# Patient Record
Sex: Female | Born: 1985 | Race: White | Hispanic: No | Marital: Married | Smoking: Never smoker
Health system: Southern US, Community
[De-identification: ages and names within clinical notes are randomized; demographics above are authoritative.]

## PROBLEM LIST (undated history)

## (undated) ENCOUNTER — Inpatient Hospital Stay (HOSPITAL_COMMUNITY): Payer: 59

## (undated) DIAGNOSIS — N946 Dysmenorrhea, unspecified: Secondary | ICD-10-CM

## (undated) DIAGNOSIS — N809 Endometriosis, unspecified: Secondary | ICD-10-CM

## (undated) DIAGNOSIS — R87629 Unspecified abnormal cytological findings in specimens from vagina: Secondary | ICD-10-CM

## (undated) DIAGNOSIS — B2790 Infectious mononucleosis, unspecified without complication: Secondary | ICD-10-CM

## (undated) DIAGNOSIS — N83209 Unspecified ovarian cyst, unspecified side: Secondary | ICD-10-CM

## (undated) DIAGNOSIS — N6019 Diffuse cystic mastopathy of unspecified breast: Secondary | ICD-10-CM

## (undated) DIAGNOSIS — N979 Female infertility, unspecified: Secondary | ICD-10-CM

## (undated) DIAGNOSIS — T7840XA Allergy, unspecified, initial encounter: Secondary | ICD-10-CM

## (undated) HISTORY — DX: Unspecified ovarian cyst, unspecified side: N83.209

## (undated) HISTORY — DX: Allergy, unspecified, initial encounter: T78.40XA

## (undated) HISTORY — DX: Infectious mononucleosis, unspecified without complication: B27.90

## (undated) HISTORY — PX: WISDOM TOOTH EXTRACTION: SHX21

## (undated) HISTORY — DX: Diffuse cystic mastopathy of unspecified breast: N60.19

## (undated) HISTORY — DX: Dysmenorrhea, unspecified: N94.6

---
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
## 2005-09-17 ENCOUNTER — Other Ambulatory Visit: Admission: RE | Admit: 2005-09-17 | Discharge: 2005-09-17 | Payer: Self-pay | Admitting: Family Medicine

## 2005-09-17 ENCOUNTER — Ambulatory Visit: Payer: Self-pay | Admitting: Family Medicine

## 2005-11-06 ENCOUNTER — Encounter: Admission: RE | Admit: 2005-11-06 | Discharge: 2005-11-06 | Payer: Self-pay | Admitting: Family Medicine

## 2006-09-20 ENCOUNTER — Other Ambulatory Visit: Admission: RE | Admit: 2006-09-20 | Discharge: 2006-09-20 | Payer: Self-pay | Admitting: Family Medicine

## 2006-09-20 ENCOUNTER — Ambulatory Visit: Payer: Self-pay | Admitting: Family Medicine

## 2008-07-10 ENCOUNTER — Ambulatory Visit: Payer: Self-pay | Admitting: Family Medicine

## 2008-08-16 ENCOUNTER — Ambulatory Visit: Payer: Self-pay | Admitting: Family Medicine

## 2009-02-28 ENCOUNTER — Ambulatory Visit: Payer: Self-pay | Admitting: Family Medicine

## 2009-06-27 ENCOUNTER — Other Ambulatory Visit: Admission: RE | Admit: 2009-06-27 | Discharge: 2009-06-27 | Payer: Self-pay | Admitting: Family Medicine

## 2009-06-27 ENCOUNTER — Ambulatory Visit: Payer: Self-pay | Admitting: Physician Assistant

## 2010-07-04 ENCOUNTER — Encounter: Payer: Self-pay | Admitting: Family Medicine

## 2010-07-09 ENCOUNTER — Encounter: Payer: Self-pay | Admitting: Family Medicine

## 2010-07-09 ENCOUNTER — Encounter: Payer: Self-pay | Admitting: *Deleted

## 2010-07-09 ENCOUNTER — Ambulatory Visit (INDEPENDENT_AMBULATORY_CARE_PROVIDER_SITE_OTHER): Payer: 59 | Admitting: Family Medicine

## 2010-07-09 VITALS — BP 120/78 | HR 72 | Ht 65.0 in | Wt 186.0 lb

## 2010-07-09 DIAGNOSIS — Z0000 Encounter for general adult medical examination without abnormal findings: Secondary | ICD-10-CM

## 2010-07-09 DIAGNOSIS — N946 Dysmenorrhea, unspecified: Secondary | ICD-10-CM | POA: Insufficient documentation

## 2010-07-09 MED ORDER — LEVONORGESTREL-ETHINYL ESTRAD 0.1-20 MG-MCG PO TABS: 1.0000 | ORAL_TABLET | Freq: Every day | ORAL | Status: DC

## 2010-07-09 NOTE — Progress Notes (Signed)
Subjective:    Patient ID: Catherine Hill, female    DOB: July 31, 1985, 25 y.o.   MRN: [id number]  HPI Catherine Hill is a 25 y.o. female who presents for a complete physical.  She has the following concerns: Menstrual periods have been somewhat irregular for the last 2 months.  Has been on the same birth control for years without problems until recently.  Denies any missed pills.  Is currently on her menstrual cycle (supposed to be), but also had bleeding last week.  Heavy now, so unable to have pap/GYN exam done today.  Has never been in a sexual relationship. Had one pap showing atypical cells, but no HPV in 2005, and has had 4 normal paps since then.  She is frustrated about her weight.  Exercises a lot but is unable to lose weight.  She does notice that she has lost inches and she is wearing smaller sizes.  Has been exercising more intensively since last September.  Following a 1600 cal day diet, sometimes less. Was able to lose more when on 1200 kcal diet.  Avoids processed foods, eating organic, healthy foods.  Recently at work, which is very stressful, she has been having "heart pains".  After having a Diet Coke, developed chest pain.  This has happened 3 times, twice was within a week.  Always occurred after drinking Diet Coke.  Also felt like her heart was beating fast/racing.  She also hadn't been sleeping well (because she was thinking about and worrying about work).  At that point, she was working 14 hour days.  She no longer is taking work home with her, reads prior to going to bed, and is sleeping better. now.  Immunization History  Administered Date(s) Administered  . DTaP 09/15/1999  . IPV 09/17/2005, 07/10/2008, 06/27/2009  . Tdap 06/27/2009   Last Pap smear: 1 year ago Last mammogram: 2007 mammo and ultrasound Last colonoscopy: n/a Last DEXA: n/a Dentist--goes twice a year Ophtho--once a year (wears contacts) Exercise--4-5 times/week (runs, walks on treadmill for an hour,  occasional weights) Has used tanning bed in past  Past Medical History  Diagnosis Date  . Dysmenorrhea   . Perennial allergic rhinitis   . Fibrocystic breast disease   . Allergy     seasonal    History reviewed. No pertinent past surgical history.  History   Social History  . Marital Status: Single    Spouse Name: N/A    Number of Children: N/A  . Years of Education: N/A   Occupational History  . 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 Smoking status: Never Smoker   . Smokeless tobacco: Never Used  . Alcohol Use: Yes     socially 3-4 drinks on weekends  . Drug Use: No  . Sexually Active: No   Other Topics Concern  . Not on file   Social History Narrative  . No narrative on file    Family History  Problem Relation Age of Onset  . Thyroid disease Mother   . Hyperlipidemia Mother   . Hyperlipidemia Father   . Vision loss Father     blind in L eye (?CVA?)  . Diabetes Maternal Uncle   . Cancer Paternal Grandfather     bladder cancer    Current outpatient prescriptions:levonorgestrel-ethinyl estradiol (AVIANE,ALESSE,LESSINA) 0.1-20 MG-MCG per tablet, Take 1 tablet by mouth daily., Disp: 28 tablet, Rfl: 11;  DISCONTD: levonorgestrel-ethinyl estradiol (AVIANE,ALESSE,LESSINA) 0.1-20 MG-MCG per tablet, Take 1 tablet by mouth  daily.  , Disp: , Rfl:  DISCONTD: levonorgestrel-ethinyl estradiol (AVIANE,ALESSE,LESSINA) 0.1-20 MG-MCG per tablet, Take 1 tablet by mouth daily., Disp: 28 tablet, Rfl: 11  No Known Allergies  Review of Systems The patient denies anorexia, fever, weight changes, headaches,  vision changes, decreased hearing, ear pain, sore throat, breast concerns, dizziness, syncope, dyspnea on exertion, cough, swelling, nausea, vomiting, diarrhea, constipation, abdominal pain, melena, hematochezia, indigestion/heartburn, hematuria, incontinence, dysuria, vaginal discharge, odor or itch, genital lesions, joint pains, numbness,  tingling, weakness, tremor, suspicious skin lesions, depression, anxiety, abnormal bleeding/bruising, or enlarged lymph nodes.  +menstrual irregularities x 2 months, palpitations and chest pain after Diet Coke    Objective:   Physical Exam BP 120/78  Pulse 72  Ht 5\' 5"  (1.651 m)  Wt 186 lb (84.369 kg)  BMI 30.95 kg/m2  LMP 06/29/2010  General Appearance:    Alert, cooperative, no distress, appears stated age, muscular  Head:    Normocephalic, without obvious abnormality, atraumatic  Eyes:    PERRL, conjunctiva/corneas clear, EOM's intact, fundi    benign  Ears:    Normal TM's and external ear canals  Nose:   Nares normal, mucosa normal, no drainage or sinus   tenderness  Throat:   Lips, mucosa, and tongue normal; teeth and gums normal  Neck:   Supple, no lymphadenopathy;  thyroid:  no   enlargement/tenderness/nodules; no carotid   bruit or JVD  Back:    Spine nontender, no curvature, ROM normal, no CVA     tenderness  Lungs:     Clear to auscultation bilaterally without wheezes, rales or     ronchi; respirations unlabored  Chest Wall:    No tenderness or deformity   Heart:    Regular rate and rhythm, S1 and S2 normal, no murmur, rub   or gallop  Breast Exam:    No tenderness, masses, or nipple discharge or inversion.      No axillary lymphadenopathy  Abdomen:     Soft, non-tender, nondistended, normoactive bowel sounds,    no masses, no hepatosplenomegaly  Genitalia:    Normal external genitalia without lesions.  BUS and vagina normal; no cervical motion tenderness.  Uterus and adnexa not enlarged, nontender, no masses.  Speculum exam and pap were not performed  Rectal:    Not performed due to age<40 and no related complaints  Extremities:   No clubbing, cyanosis or edema  Pulses:   2+ and symmetric all extremities  Skin:   Skin color, texture, turgor normal, no rashes or lesions  Lymph nodes:   Cervical, supraclavicular, and axillary nodes normal  Neurologic:   CNII-XII intact,  normal strength, sensation and gait; reflexes 2+ and symmetric throughout          Psych:   Normal mood, affect, hygiene and grooming.       Assessment & Plan:   1. Routine general medical examination at a health care facility  Visual acuity screening, Lipid panel  2. Dysmenorrhea  levonorgestrel-ethinyl estradiol (AVIANE,ALESSE,LESSINA) 0.1-20 MG-MCG per tablet, DISCONTINUED: levonorgestrel-ethinyl estradiol (AVIANE,ALESSE,LESSINA) 0.1-20 MG-MCG per tablet   Discussed monthly self breast exams and yearly mammograms after the age of 54; at least 30 minutes of aerobic activity at least 5 days/week; proper sunscreen use and avoidance of tanning booths reviewed; healthy diet, including goals of calcium and vitamin D intake and alcohol recommendations (less than or equal to 1 drink/day) reviewed; regular seatbelt use; changing batteries in smoke detectors.  Immunization recommendations discussed--she is up to date.  Reassured that  if she is continuing to lose inches, that she is getting benefit from her diet and exercise, and should soon show on the scale as well.  Healthy diet, portions, cutting back on calories to 1200-1400 kcal daily for faster weight loss.  Continue daily exercise.  Reassured that it sounds as though stress and caffeine (mainly caffeine) was related to her episodes of palpitations and chest pains (that she was referring to as anxiety attacks).  No other symptoms of anxiety.  Follow up if continues to have problems with palpitations, chest pain; pt to avoid caffeine

## 2010-07-10 ENCOUNTER — Other Ambulatory Visit: Payer: Self-pay | Admitting: *Deleted

## 2010-07-10 ENCOUNTER — Other Ambulatory Visit: Payer: 59

## 2010-07-10 DIAGNOSIS — Z0000 Encounter for general adult medical examination without abnormal findings: Secondary | ICD-10-CM

## 2010-07-11 ENCOUNTER — Other Ambulatory Visit: Payer: Self-pay | Admitting: *Deleted

## 2010-07-11 ENCOUNTER — Other Ambulatory Visit: Payer: 59

## 2010-07-11 DIAGNOSIS — Z0000 Encounter for general adult medical examination without abnormal findings: Secondary | ICD-10-CM

## 2010-07-11 LAB — LIPID PANEL
Cholesterol: 191 mg/dL (ref 0–200)
Total CHOL/HDL Ratio: 4.2 Ratio
VLDL: 13 mg/dL (ref 0–40)

## 2010-07-14 ENCOUNTER — Telehealth: Payer: Self-pay | Admitting: *Deleted

## 2010-07-14 NOTE — Telephone Encounter (Signed)
Left message on pt's cell number to return my call for lab results.

## 2010-07-16 ENCOUNTER — Telehealth: Payer: Self-pay | Admitting: *Deleted

## 2010-07-16 NOTE — Telephone Encounter (Signed)
Spoke with patient, she was given lab results and I mailed out diet info to her.

## 2010-07-16 NOTE — Telephone Encounter (Signed)
Message copied by Melonie Florida on Wed Jul 16, 2010 11:00 AM ------      Message from: Joselyn Arrow      Created: Sun Jul 13, 2010  9:36 PM       Advise pt borderline high cholesterol.  Goal LDL <130, HDL>50.  Please send info on low cholesterol diet.  Re-check next year

## 2011-02-04 ENCOUNTER — Encounter: Payer: Self-pay | Admitting: Medical

## 2011-02-04 ENCOUNTER — Ambulatory Visit (INDEPENDENT_AMBULATORY_CARE_PROVIDER_SITE_OTHER): Payer: 59 | Admitting: Medical

## 2011-02-04 DIAGNOSIS — H68009 Unspecified Eustachian salpingitis, unspecified ear: Secondary | ICD-10-CM | POA: Insufficient documentation

## 2011-02-04 MED ORDER — AMOXICILLIN 875 MG PO TABS: 875.0000 mg | ORAL_TABLET | Freq: Two times a day (BID) | ORAL | Status: AC

## 2011-02-04 NOTE — Progress Notes (Signed)
Subjective:   HPI  Catherine Hill is a 25 y.o. female who presents with 3 day hx/o cold symptoms, but now has worse right ear pain, lymph nodes are swollen on the right, and feels fatigued.  Feels some dizziness today.  Denies fever, sore throat, cough, headache.  Using some Ibuprofen.  No other aggravating or relieving factors.    No other c/o.  The following portions of the patient's history were reviewed and updated as appropriate: allergies, current medications, past family history, past medical history, past social history, past surgical history and problem list.  Past Medical History  Diagnosis Date  . Dysmenorrhea   . Perennial allergic rhinitis   . Fibrocystic breast disease   . Allergy     seasonal    Review of Systems Constitutional: -fever, -chills, -sweats, -unexpected -weight change,-fatigue ENT: -runny nose, +ear pain, -sore throat Cardiology:  -chest pain, -palpitations, -edema Respiratory: -cough, -shortness of breath, -wheezing Gastroenterology: -abdominal pain, -nausea, -vomiting, -diarrhea, -constipation Hematology: -bleeding or bruising problems Musculoskeletal: -arthralgias, -myalgias, -joint swelling, -back pain Ophthalmology: -vision changes Urology: -dysuria, -difficulty urinating, -hematuria, -urinary frequency, -urgency Neurology: -headache, -weakness, -tingling, -numbness, + DIZZY   Objective:   Filed Vitals:   02/04/11 1343  BP: 110/70  Pulse: 68  Temp: 98.3 F (36.8 C)  Resp: 16    General appearance: Alert, WD/WN, no distress                             Skin: warm, no rash                           Head: no sinus tenderness,                            Eyes: conjunctiva normal, corneas clear, PERRLA                            Ears: pearly TMs, external ear canals normal                          Nose: septum midline, turbinates swollen, with erythema and clear discharge             Mouth/throat: MMM, tongue normal, mild pharyngeal erythema                         Neck: supple, shoddy tender right side lymph nodes, no thyromegaly, nontender                          Heart: RRR, normal S1, S2, no murmurs                         Lungs: CTA bilaterally, no wheezes, rales, or rhonchi      Assessment and Plan:   Encounter Diagnosis  Name Primary?  . Eustachian salpingitis Yes   Discussed supportive care, nasal decongestant, c/t Ibuprofen, rest, hydrate well, and begin sample of Veramyst.  If not improving in the next few days, worse ear pain or fever, begin Amoxicillin.   Call or return if worse or not improving in 2-3 days.

## 2011-02-04 NOTE — Patient Instructions (Signed)
Use OTC decongestant such as sudafed or Mucinex D.  Increase your water intake.   You can continue Ibuprofen for pain.  If worse over the next few days, begin Amoxicillin, otherwise this may resolve with symptoms measure only.     Barotitis Media Barotitis media is soreness (inflammation) of the area behind the eardrum (middle ear). This occurs when the auditory tube (Eustachian tube) leading from the back of the throat to the eardrum is blocked. When it is blocked air cannot move in and out of the middle ear to equalize pressure changes. These pressure changes come from changes in altitude when:  Flying.   Driving in the mountains.   Diving.  Problems are more likely to occur with pressure changes during times when you are congested as from:  Hay fever.   Upper respiratory infection.   A cold.  Damage or hearing loss (barotrauma) caused by this may be permanent. HOME CARE INSTRUCTIONS   Use medicines as recommended by your caregiver. Over the counter medicines will help unblock the canal and can help during times of air travel.   Do not put anything into your ears to clean or unplug them. Eardrops will not be helpful.   Do not swim, dive, or fly until your caregiver says it is all right to do so. If these activities are necessary, chewing gum with frequent swallowing may help. It is also helpful to hold your nose and gently blow to pop your ears for equalizing pressure changes. This forces air into the Eustachian tube.   For little ones with problems, give your baby a bottle of water or juice during periods when pressure changes would be anticipated such as during take offs and landings associated with air travel.   Only take over-the-counter or prescription medicines for pain, discomfort, or fever as directed by your caregiver.   A decongestant may be helpful in de-congesting the middle ear and make pressure equalization easier. This can be even more effective if the drops (spray)  are delivered with the head lying over the edge of a bed with the head tilted toward the ear on the affected side.   If your caregiver has given you a follow-up appointment, it is very important to keep that appointment. Not keeping the appointment could result in a chronic or permanent injury, pain, hearing loss and disability. If there is any problem keeping the appointment, you must call back to this facility for assistance.  SEEK IMMEDIATE MEDICAL CARE IF:   You develop a severe headache, dizziness, severe ear pain, or bloody or pus-like drainage from your ears.   An oral temperature above 102 F (38.9 C) develops.   Your problems do not improve or become worse.  MAKE SURE YOU:   Understand these instructions.   Will watch your condition.   Will get help right away if you are not doing well or get worse.  Document Released: 01/24/2000 Document Revised: 10/08/2010 Document Reviewed: 09/01/2007 [facility] Patient Information 2012 [place], [place].

## 2011-02-06 ENCOUNTER — Telehealth: Payer: Self-pay | Admitting: Internal Medicine

## 2011-02-06 ENCOUNTER — Other Ambulatory Visit: Payer: Self-pay | Admitting: Medical

## 2011-02-06 MED ORDER — FLUCONAZOLE 150 MG PO TABS: 150.0000 mg | ORAL_TABLET | Freq: Once | ORAL | Status: AC

## 2011-02-06 NOTE — Telephone Encounter (Signed)
Notified pt. 

## 2011-02-06 NOTE — Telephone Encounter (Signed)
Diflucan sent

## 2011-02-10 DIAGNOSIS — B2790 Infectious mononucleosis, unspecified without complication: Secondary | ICD-10-CM

## 2011-02-10 DIAGNOSIS — N83209 Unspecified ovarian cyst, unspecified side: Secondary | ICD-10-CM

## 2011-02-10 HISTORY — DX: Infectious mononucleosis, unspecified without complication: B27.90

## 2011-02-10 HISTORY — DX: Unspecified ovarian cyst, unspecified side: N83.209

## 2011-02-23 ENCOUNTER — Ambulatory Visit (INDEPENDENT_AMBULATORY_CARE_PROVIDER_SITE_OTHER): Payer: 59 | Admitting: Family Medicine

## 2011-02-23 ENCOUNTER — Encounter: Payer: Self-pay | Admitting: Family Medicine

## 2011-02-23 VITALS — BP 100/60 | HR 72 | Temp 97.9°F | Ht 65.0 in | Wt 179.0 lb

## 2011-02-23 DIAGNOSIS — B2790 Infectious mononucleosis, unspecified without complication: Secondary | ICD-10-CM

## 2011-02-23 DIAGNOSIS — R591 Generalized enlarged lymph nodes: Secondary | ICD-10-CM

## 2011-02-23 DIAGNOSIS — R599 Enlarged lymph nodes, unspecified: Secondary | ICD-10-CM

## 2011-02-23 LAB — POCT MONO (EPSTEIN BARR VIRUS): Mono, POC: POSITIVE — AB

## 2011-02-23 NOTE — Progress Notes (Signed)
Chief complaint:  nasal congestion x 5 days, clear mucus. Excessively fatigued-still no better from visit with Catherine Hill 02/04/11-seen for R ear pain, now R and L ear itching. R lymph node still swollen. She was diagnosed with viral conjuctivitis on 02/08/11, went to [facility]-eye watering and redness not resolved-she still cannot wear contacts w/o pain  HPI:  Patient has been sick since before Christmas.  Back then, had fatigue, and R ear pain and swollen glands.  She took a course of Amoxacillin.  Eventually R ear pain improved, but has persistant fatigue, and still hasn't gotten better.  Also had problems with R eye, diagnosed with conjunctivitis, seen at [facility].  Never needed to use the antibiotic drops.  Ongoing runny eye, slightly pink.  Denies yellow crusting.  Has been continuing to use the Veramyst 2 sprays each nostril once a day.  +runny nose, sniffling, clear mucus, and slight cough.  Denies fevers.  Using Mucinex D and Veramyst.  Complaining of swelling in front of R ear.  Swollen glands in R neck had started to improve, but now increasing again.  Not particularly painful (slightly sore in front of R ear). Had been resting a lot over the weekend.  Lots of sick contacts at work. Mother suggested it could be mono  Past Medical History  Diagnosis Date  . Dysmenorrhea   . Fibrocystic breast disease   . Allergy     seasonal    History reviewed. No pertinent past surgical history.  History   Social History  . Marital Status: Single    Spouse Name: N/A    Number of Children: N/A  . Years of Education: N/A   Occupational History  . 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 Smoking status: Never Smoker   . Smokeless tobacco: Never Used  . Alcohol Use: Yes     socially 3-4 drinks on weekends  . Drug Use: No  . Sexually Active: No   Other Topics Concern  . Not on file   Social History Narrative  . No narrative on file    Family  History  Problem Relation Age of Onset  . Thyroid disease Mother   . Hyperlipidemia Mother   . Hyperlipidemia Father   . Vision loss Father     blind in L eye (?CVA?)  . Diabetes Maternal Uncle   . Cancer Paternal Grandfather     bladder cancer  . Hyperlipidemia Sister    Current Outpatient Prescriptions on File Prior to Visit  Medication Sig Dispense Refill  . levonorgestrel-ethinyl estradiol (AVIANE,ALESSE,LESSINA) 0.1-20 MG-MCG per tablet Take 1 tablet by mouth daily.  28 tablet  11   No Known Allergies  ROS:  Denies nausea, vomiting, diarrhea.  No fevers, skin rashes, myalgias, arthralgias.  PHYSICAL EXAM: BP 100/60  Pulse 72  Temp(Src) 97.9 F (36.6 C) (Oral)  Ht 5\' 5"  (1.651 m)  Wt 179 lb (81.194 kg)  BMI 29.79 kg/m2  LMP 02/16/2011 Well developed, pleasant female, frequently sniffling, in no distress HEENT:  PERRL, EOMI.  Conjunctiva clear.  Inner portion of lower eye lid is erythematous. TM's and EAC's normal bilaterally. OP clear.  Sinuses nontender.  Nasal mucosa mildly edematous, nonerythematous, no purulence.   Neck: lymphadenopathy presents preauricularly on R, as well as 2 nodes in posterior cervical chain (posterior to SCM muscle) and one node in angle of jaw.  None are erythematous, warm, or fluctuant Heart: regular rate and rhythm without murmur Lungs: clear  bilaterally Abdomen: soft, nontender, no organomegaly or mass  ASSESSMENT/PLAN: 1. Lymphadenopathy  POCT Mono (Epstein Barr Virus)  2. Mononucleosis      URI with R sided lymphadenopathy.  Possibly could be a second virus, on top of viral illness that seemed to improve around Christmas.  Continue supportive measures.  Will check monospot --POSITIVE.  Handout given; no contact sports.  If lymphadenopathy persists, worsens, develops fevers, discolored mucus, shortness of breath, or other worsening symptoms, to return for re-evaluation.

## 2011-02-23 NOTE — Patient Instructions (Addendum)
You can start taking antihistamines such as Claritin or Zyrtec in addition to the Mucinex-D to help with the sniffling and watery eye.  Continue the nasal steroid spray.  If your eye symptoms aren't improving, see your eye doctor.  Follow up if high fevers, worsening swelling of glands with tenderness and warmth, shortness of breath, worsening cough, or other worsening or ongoing symptoms   Infectious Mononucleosis Infectious mononucleosis (mono) is a common germ (viral) infection in children, teenagers, and young adults.  CAUSES  Mono is an infection caused by the Malachi Carl virus. The virus is spread by close personal contact with someone who has the infection. It can be passed by contact with your saliva through things such as kissing or sharing drinking glasses. Sometimes, the infection can be spread from someone who does not appear sick but still spreads the virus (asymptomatic carrier state).  SYMPTOMS  The most common symptoms of Mono are:  Sore throat.   Headache.   Fatigue.   Muscle aches.   Swollen glands.   Fever.   Poor appetite.   Enlarged liver or spleen.  The less common symptoms can include:  Rash.   Feeling sick to your stomach (nauseous).   Abdominal pain.  DIAGNOSIS  Mono is diagnosed by a blood test.  TREATMENT  Treatment of mono is usually at home. There is no medicine that cures this virus. Sometimes hospital treatment is needed in severe cases. Steroid medicine sometimes is needed if the swelling in the throat causes breathing or swallowing problems.  HOME CARE INSTRUCTIONS   Drink enough fluids to keep your urine clear or pale yellow.   Eat soft foods. Cool foods like popsicles or ice cream can soothe a sore throat.   Only take over-the-counter or prescription medicines for pain, discomfort, or fever as directed by your caregiver. Children under 31 years of age should not take aspirin.   Gargle salt water. This may help relieve your sore throat.  Put 1 teaspoon (tsp) of salt in 1 cup of warm water. Sucking on hard candy may also help.   Rest as needed.   Start regular activities gradually after the fever is gone. Be sure to rest when tired.   Avoid strenuous exercise or contact sports until your caregiver says it is okay. The liver and spleen could be seriously injured.   Avoid sharing drinking glasses or kissing until your caregiver tells you that you are no longer contagious.  SEEK MEDICAL CARE IF:   Your fever is not gone after 7 days.   Your activity level is not back to normal after 2 weeks.   You have yellow coloring to eyes and skin (jaundice).  SEEK IMMEDIATE MEDICAL CARE IF:   You have severe pain in the abdomen or shoulder.   You have trouble swallowing or drooling.   You have trouble breathing.   You develop a stiff neck.   You develop a severe headache.   You cannot stop throwing up (vomiting).   You have convulsions.   You are confused.   You have trouble with balance.   You develop signs of body fluid loss (dehydration):   Weakness.   Sunken eyes.   Pale skin.   Dry mouth.   Rapid breathing or pulse.  MAKE SURE YOU:   Understand these instructions.   Will watch your condition.   Will get help right away if you are not doing well or get worse.  Document Released: 01/24/2000 Document Revised: 10/08/2010 Document  Reviewed: 11/22/2007 [employer] Patient Information 2012 [place], [place].

## 2011-02-27 ENCOUNTER — Emergency Department (HOSPITAL_COMMUNITY): Payer: 59

## 2011-02-27 ENCOUNTER — Encounter (HOSPITAL_COMMUNITY): Payer: Self-pay

## 2011-02-27 ENCOUNTER — Emergency Department (HOSPITAL_COMMUNITY)
Admission: EM | Admit: 2011-02-27 | Discharge: 2011-02-27 | Disposition: A | Discharge status: Home / Self Care | Payer: 59 | Attending: Emergency Medicine | Admitting: Emergency Medicine

## 2011-02-27 DIAGNOSIS — R109 Unspecified abdominal pain: Secondary | ICD-10-CM | POA: Insufficient documentation

## 2011-02-27 DIAGNOSIS — R161 Splenomegaly, not elsewhere classified: Secondary | ICD-10-CM | POA: Insufficient documentation

## 2011-02-27 DIAGNOSIS — R10812 Left upper quadrant abdominal tenderness: Secondary | ICD-10-CM | POA: Insufficient documentation

## 2011-02-27 DIAGNOSIS — R112 Nausea with vomiting, unspecified: Secondary | ICD-10-CM | POA: Insufficient documentation

## 2011-02-27 LAB — DIFFERENTIAL
Basophils Absolute: 0.1 10*3/uL (ref 0.0–0.1)
Lymphocytes Relative: 35 % (ref 12–46)
Lymphs Abs: 2.5 10*3/uL (ref 0.7–4.0)
Neutro Abs: 3.9 10*3/uL (ref 1.7–7.7)
Neutrophils Relative %: 56 % (ref 43–77)

## 2011-02-27 LAB — CBC
MCV: 88.3 fL (ref 78.0–100.0)
Platelets: 174 10*3/uL (ref 150–400)
RBC: 4.2 MIL/uL (ref 3.87–5.11)
RDW: 11.9 % (ref 11.5–15.5)
WBC: 7 10*3/uL (ref 4.0–10.5)

## 2011-02-27 MED ORDER — HYDROCODONE-ACETAMINOPHEN 5-325 MG PO TABS: 1.0000 | ORAL_TABLET | Freq: Three times a day (TID) | ORAL | Status: DC | PRN

## 2011-02-27 MED ORDER — ONDANSETRON HCL 4 MG/2ML IJ SOLN
4.0000 mg | Freq: Once | INTRAMUSCULAR | Status: AC
  Administered 2011-02-27: 4 mg via INTRAVENOUS
  Filled 2011-02-27: qty 2

## 2011-02-27 MED ORDER — ONDANSETRON HCL 4 MG PO TABS
4.0000 mg | ORAL_TABLET | Freq: Four times a day (QID) | ORAL | Status: AC
Start: 2011-02-27 — End: 2011-03-06

## 2011-02-27 MED ORDER — SODIUM CHLORIDE 0.9 % IV SOLN
INTRAVENOUS | Status: DC
  Administered 2011-02-27: 10:00:00 via INTRAVENOUS

## 2011-02-27 MED ORDER — MORPHINE SULFATE 4 MG/ML IJ SOLN
4.0000 mg | Freq: Once | INTRAMUSCULAR | Status: AC
  Administered 2011-02-27: 4 mg via INTRAVENOUS
  Filled 2011-02-27: qty 1

## 2011-02-27 NOTE — ED Provider Notes (Signed)
History     CSN: [id number]  Arrival date & time 02/27/11  0909   First MD Initiated Contact with Patient 02/27/11 0915      Chief Complaint  Patient presents with  . Abdominal Pain    (Consider location/radiation/quality/duration/timing/severity/associated sxs/prior treatment) HPI Comments: Patient comes in today with a chief complaint of LUQ abdominal pain.  Patient was diagnosed with Mononucleosis by her PCP four days ago.  She reports that the left sided abdominal pain began last evening and has become progressively worse.  The pain does not radiate.  Pain is associated with nausea.  She had one episode of vomiting approximately one hour ago.  No vomiting since that time.  She denies any trauma.  No contact sports.  She reports that she has been at home resting.  She denies any diarrhea.  Last BM was yesterday afternoon.  The history is provided by the patient.    Past Medical History  Diagnosis Date  . Dysmenorrhea   . Fibrocystic breast disease   . Allergy     seasonal    History reviewed. No pertinent past surgical history.  Family History  Problem Relation Age of Onset  . Thyroid disease Mother   . Hyperlipidemia Mother   . Hyperlipidemia Father   . Vision loss Father     blind in L eye (?CVA?)  . Diabetes Maternal Uncle   . Cancer Paternal Grandfather     bladder cancer  . Hyperlipidemia Sister     History  Substance Use Topics  . Smoking status: Never Smoker   . Smokeless tobacco: Never Used  . Alcohol Use: Yes     socially 3-4 drinks on weekends    OB History    Grav Para Term Preterm Abortions TAB SAB Ect Mult Living                  Review of Systems  Constitutional: Negative for fever and chills.  Gastrointestinal: Positive for nausea, vomiting and abdominal pain. Negative for diarrhea, blood in stool and abdominal distention.  Genitourinary: Negative for dysuria, hematuria, vaginal bleeding and vaginal discharge.  Musculoskeletal: Negative  for back pain.  Skin: Negative for color change and rash.  Neurological: Negative for dizziness, syncope and light-headedness.    Allergies  Review of patient's allergies indicates no known allergies.  Home Medications   Current Outpatient Rx  Name Route Sig Dispense Refill  . IBUPROFEN 200 MG PO TABS Oral Take 600 mg by mouth every 8 (eight) hours as needed. For pain.    Marland Kitchen LEVONORGESTREL-ETHINYL ESTRAD 0.1-20 MG-MCG PO TABS Oral Take 1 tablet by mouth daily. 28 tablet 11  . PSEUDOEPHEDRINE-GUAIFENESIN ER 60-600 MG PO TB12 Oral Take 1 tablet by mouth every 12 (twelve) hours.      BP 130/86  Pulse 62  Temp(Src) 98.8 F (37.1 C) (Oral)  Resp 18  SpO2 100%  LMP 02/16/2011  Physical Exam  Nursing note and vitals reviewed. Constitutional: She is oriented to person, place, and time. She appears well-developed and well-nourished. No distress.  HENT:  Head: Normocephalic.  Neck: Normal range of motion. Neck supple.  Cardiovascular: Normal rate, regular rhythm and normal heart sounds.   Pulmonary/Chest: Effort normal and breath sounds normal. No respiratory distress. She has no wheezes. She has no rales.  Abdominal: Soft. Bowel sounds are normal. She exhibits no distension and no mass. There is splenomegaly. There is no hepatomegaly. There is tenderness in the left upper quadrant. There is no rigidity,  no rebound, no guarding, no CVA tenderness, no tenderness at McBurney's point and negative Murphy's sign.  Neurological: She is alert and oriented to person, place, and time.  Skin: Skin is warm and dry. She is not diaphoretic.  Psychiatric: She has a normal mood and affect.    ED Course  Procedures (including critical care time)   Labs Reviewed  CBC  DIFFERENTIAL   No results found.   No diagnosis found.  9:30 AM Discussed patient with Dr. Jeraldine Loots.  Will order CBC and abdominal ultrasound to rule out splenic rupture.  Patient hemodynamically stable at this time.   10:30  AM Reassessed patient.  Patient is currently getting an ultrasound of her abdomen.  She reports that her pain has improved.  VSS. 11:12 AM Discussed results of the Abdominal ultrasound with patient.  Patient reports that her pain has improved.  Instructed patient to avoid contact sports or any activities that could cause abdominal trauma.  Patient verbalizes understanding.  MDM  Patient with recent diagnosis of Mononucleosis comes in with LUQ pain.  Patient is hemodynamically stable.  Abdominal ultrasound was ordered to rule out splenic rupture.  Ultrasound negative.  Feel that patient can be discharged home with pain medication.  Patient and parents agree with the plan.        Pascal Lux [place], PA-C 02/27/11 1510

## 2011-02-27 NOTE — ED Notes (Signed)
Pt called out c/o abd. Pain. Dr. Rana Snare informed. Ordered additional Morphine.

## 2011-02-27 NOTE — ED Notes (Signed)
Per pt....she has not felt well since Thanksgiving, and started feeling worse around Christmas.  She went to her family doctor this past Monday and was diagnosed with mono.  Yesterday her abd started feeling "achy.  this morning when she got out of the shower, she experienced a sharp pain in her lower left abd.  She vomited at that time.

## 2011-02-28 NOTE — ED Provider Notes (Signed)
Medical screening examination/treatment/procedure(s) were conducted as a shared visit with non-physician practitioner(s) and myself.  I personally evaluated the patient during the encounter Well appearing young F w recent diagnosis of mono, now w continued abd pain.  [place] and labs reassuring.  Patient d/c home w analgesics.  Gerhard Munch, MD 02/28/11 [phone]

## 2011-03-03 ENCOUNTER — Telehealth: Payer: Self-pay | Admitting: Family Medicine

## 2011-03-03 ENCOUNTER — Encounter: Payer: Self-pay | Admitting: Medical

## 2011-03-03 ENCOUNTER — Ambulatory Visit (INDEPENDENT_AMBULATORY_CARE_PROVIDER_SITE_OTHER): Payer: 59 | Admitting: Medical

## 2011-03-03 VITALS — BP 110/68 | HR 72 | Temp 97.9°F | Resp 16 | Wt 178.0 lb

## 2011-03-03 DIAGNOSIS — R319 Hematuria, unspecified: Secondary | ICD-10-CM | POA: Insufficient documentation

## 2011-03-03 DIAGNOSIS — M549 Dorsalgia, unspecified: Secondary | ICD-10-CM | POA: Insufficient documentation

## 2011-03-03 DIAGNOSIS — B2790 Infectious mononucleosis, unspecified without complication: Secondary | ICD-10-CM

## 2011-03-03 LAB — POCT URINALYSIS DIPSTICK
Bilirubin, UA: NEGATIVE
Glucose, UA: NEGATIVE
Leukocytes, UA: NEGATIVE
Nitrite, UA: NEGATIVE
pH, UA: 7

## 2011-03-03 MED ORDER — CIPROFLOXACIN HCL 500 MG PO TABS: 500.0000 mg | ORAL_TABLET | Freq: Two times a day (BID) | ORAL | Status: AC

## 2011-03-03 MED ORDER — OXYCODONE-ACETAMINOPHEN 7.5-500 MG PO TABS: 1.0000 | ORAL_TABLET | ORAL | Status: AC | PRN

## 2011-03-03 NOTE — Telephone Encounter (Signed)
Have her set up an appointment to be seen 

## 2011-03-03 NOTE — Progress Notes (Signed)
Subjective:   HPI Catherine Hill is a 26 y.o. female who presents for complaint of back pain.  She is here with her mother today. She was seen here on 02/23/11 with a diagnosis of mono.  Over the next few days she ended up going to the emergency department due to severe back pain.  Given the recent diagnosis of mono, and abdominal ultrasound and CBC was performed.  She did not have hepatosplenomegaly, and her blood count normal.  Her mother notes that she ended of getting 2 rounds of morphine and 2 rounds of Zofran for pain and nausea, and neither really helped.  She was discharged with a prescription for hydrocodone.  She notes that she continues to have left-sided back and abdominal pain in the area of the spleen, and she says the emergency department attributed this to spasms of the spleen. She says that the pain is intermittent, but it will be excruciating when it comes on. She denies fever, but she continues to have nausea.  She denies history of kidney stone, no history of urinary tract infection, no history of pancreatitis, last alcohol use was around new years.  No other symptoms.  Her mother did give her stool softener any event this is constipation, and is clean her out yesterday, but she notes that her bowel movements are usually regular twice a day without problem in general.  No other aggravating or relieving factors.  No other c/o.  The following portions of the patient's history were reviewed and updated as appropriate: allergies, current medications, past family history, past medical history, past social history, past surgical history and problem list.  Past Medical History  Diagnosis Date  . Dysmenorrhea   . Fibrocystic breast disease   . Allergy     seasonal   Review of Systems Gen.: No fever, chills, sweats Skin: No rash HEENT: Negative Heart: No chest pain or palpitations Lungs: No shortness of breath or wheezing GI: Left-sided abdominal pain, nausea, no vomiting, no  diarrhea no blood in stool GU: No dysuria, urinary frequency, urgency, hematuria gyn: Negative     Objective:   Physical Exam  General appearance: alert, no distress, WD/WN, in pain Heart: RRR, normal S1, S2, no murmurs Lungs: CTA bilaterally Abdomen: Positive bowel sounds, soft, moderate tenderness left upper quadrant, mildly tender left side in general, no organomegaly, no mass Back: +left CVA tenderness Pulses: 2+ UE and LE     Assessment and Plan:    Encounter Diagnoses  Name Primary?  . Back pain Yes  . Hematuria   . Mononucleosis    Back pain-we discussed possible etiologies including ongoing spasm of the spleen, urinary tract infection, pyelonephritis, renal stone, or other process. Interestingly, her urinalysis today shows large blood, trace ketones, trace protein.  Last menstrual period January 7.  Supervising physician Dr. Susann Givens and I did a urine microscopic showing RBCs, a few epithelial's, lots of bacteria, and some crenated red blood cells.  I reviewed her recent abdominal ultrasound results and CBC from the emergency department.  At this point we will cover for urinary tract infection/pyelonephritis with Cipro.  Urine sent for culture.  Advise she hydrate well to water, wrote new prescription today for stronger pain medication Percocet, advise she not take hydrocodone simultaneously.  She can continue Zofran for nausea.  If worse in the meantime including fever, worse pain, intractable pain and nausea, report to the emergency department or return.  Mono-no splenomegaly on exam today, continue supportive care for this  Follow  up pending urine culture

## 2011-03-03 NOTE — Patient Instructions (Signed)
Drink lots of water to flush the kidneys.  Change to the Percocet pain medication as needed for now, up to every 4-6 hours.  Begin Cipro antibiotic tonight.   Continue Zofran for nausea.  If worse pain, fever, uncontrollable nausea and pain, then go back to the emergency dept or return here.

## 2011-03-03 NOTE — Telephone Encounter (Signed)
Pt coming in tomorrow

## 2011-03-04 ENCOUNTER — Encounter: Payer: Self-pay | Admitting: Medical

## 2011-03-04 ENCOUNTER — Ambulatory Visit: Payer: 59 | Admitting: Family Medicine

## 2011-03-05 ENCOUNTER — Emergency Department (HOSPITAL_COMMUNITY)
Admission: EM | Admit: 2011-03-05 | Discharge: 2011-03-05 | Disposition: A | Discharge status: Home / Self Care | Payer: 59 | Attending: Emergency Medicine | Admitting: Emergency Medicine

## 2011-03-05 ENCOUNTER — Emergency Department (HOSPITAL_COMMUNITY): Payer: 59

## 2011-03-05 ENCOUNTER — Encounter (HOSPITAL_COMMUNITY): Payer: Self-pay | Admitting: Emergency Medicine

## 2011-03-05 DIAGNOSIS — N83202 Unspecified ovarian cyst, left side: Secondary | ICD-10-CM

## 2011-03-05 DIAGNOSIS — R1032 Left lower quadrant pain: Secondary | ICD-10-CM | POA: Insufficient documentation

## 2011-03-05 DIAGNOSIS — R10819 Abdominal tenderness, unspecified site: Secondary | ICD-10-CM | POA: Insufficient documentation

## 2011-03-05 DIAGNOSIS — R110 Nausea: Secondary | ICD-10-CM | POA: Insufficient documentation

## 2011-03-05 DIAGNOSIS — N83209 Unspecified ovarian cyst, unspecified side: Secondary | ICD-10-CM | POA: Insufficient documentation

## 2011-03-05 DIAGNOSIS — R319 Hematuria, unspecified: Secondary | ICD-10-CM | POA: Insufficient documentation

## 2011-03-05 LAB — CBC
Hemoglobin: 13.2 g/dL (ref 12.0–15.0)
MCH: 29.9 pg (ref 26.0–34.0)
MCHC: 33.9 g/dL (ref 30.0–36.0)
MCV: 88.2 fL (ref 78.0–100.0)
Platelets: 172 10*3/uL (ref 150–400)
RBC: 4.41 MIL/uL (ref 3.87–5.11)

## 2011-03-05 LAB — POCT I-STAT, CHEM 8
Hemoglobin: 13.9 g/dL (ref 12.0–15.0)
Sodium: 138 mEq/L (ref 135–145)
TCO2: 25 mmol/L (ref 0–100)

## 2011-03-05 LAB — URINALYSIS, ROUTINE W REFLEX MICROSCOPIC
Bilirubin Urine: NEGATIVE
Ketones, ur: NEGATIVE mg/dL
Nitrite: NEGATIVE
Specific Gravity, Urine: 1.022 (ref 1.005–1.030)
Urobilinogen, UA: 0.2 mg/dL (ref 0.0–1.0)

## 2011-03-05 LAB — WET PREP, GENITAL
Trich, Wet Prep: NONE SEEN
Yeast Wet Prep HPF POC: NONE SEEN

## 2011-03-05 LAB — DIFFERENTIAL
Basophils Relative: 1 % (ref 0–1)
Eosinophils Absolute: 0 10*3/uL (ref 0.0–0.7)
Eosinophils Relative: 0 % (ref 0–5)
Lymphs Abs: 2.1 10*3/uL (ref 0.7–4.0)
Monocytes Relative: 8 % (ref 3–12)

## 2011-03-05 LAB — URINE MICROSCOPIC-ADD ON

## 2011-03-05 LAB — PREGNANCY, URINE: Preg Test, Ur: NEGATIVE

## 2011-03-05 MED ORDER — ONDANSETRON HCL 4 MG/2ML IJ SOLN
4.0000 mg | Freq: Once | INTRAMUSCULAR | Status: AC
  Administered 2011-03-05: 4 mg via INTRAVENOUS
  Filled 2011-03-05: qty 2

## 2011-03-05 MED ORDER — SODIUM CHLORIDE 0.9 % IV BOLUS (SEPSIS)
1000.0000 mL | Freq: Once | INTRAVENOUS | Status: AC
  Administered 2011-03-05: 1000 mL via INTRAVENOUS

## 2011-03-05 MED ORDER — MORPHINE SULFATE 4 MG/ML IJ SOLN
4.0000 mg | Freq: Once | INTRAMUSCULAR | Status: AC
  Administered 2011-03-05: 4 mg via INTRAVENOUS
  Filled 2011-03-05: qty 1

## 2011-03-05 NOTE — ED Provider Notes (Cosign Needed)
6:30 AM Patient care assumed by myself at shift change. On my initial examination, patient is alert and oriented, NAD. Heart RRR. Normal respiratory effort and excursion. Abdomen soft, mild left lower quadrant tenderness to palpation. External genitalia normal in appearance. Vaginal mucosa pink without lesions. There is thick white vaginal discharge lining the vaginal walls. Cervical os is closed. There is no cervical motion tenderness. There is no palpated adnexal mass. There is left adnexal tenderness. Wet prep and GC/Chlamydia samples obtained.    7:45 AM Wet prep results reviewed. TNTC WBC with no other abnormalities, suspect inflammatory response without infection given other findings. Discussed results with patient and father. They express concern about the severity of her pain prior to ED arrival that was uncontrolled with PO percocet. US pelvis ordered to r/o ovarian torsion assoc with ovarian cyst. No additional pain medication needed at this time.   8:58 AM   US Transvaginal Non-ob  03/05/2011  *RADIOLOGY REPORT*  Clinical Data:  Known left ovarian cyst, pain, evaluate for torsion  TRANSABDOMINAL AND TRANSVAGINAL ULTRASOUND OF PELVIS DOPPLER ULTRASOUND OF OVARIES  Technique:  Both transabdominal and transvaginal ultrasound examinations of the pelvis were performed. Transabdominal technique was performed for global imaging of the pelvis including uterus, ovaries, adnexal regions, and pelvic cul-de-sac.  It was necessary to proceed with endovaginal exam following the transabdominal exam to visualize the endometrium.  Color and duplex Doppler ultrasound was utilized to evaluate blood flow to the ovaries.  Comparison:  CT abdomen pelvis dated 03/05/2011  Findings:  Uterus:  Normal in size and appearance, measuring 4.9 x 2.7 x 4.0 cm.  Endometrium:  Normal in thickness and appearance, measuring 4 mm.  Right ovary: Normal appearance/no adnexal mass, measuring 2.6 x 1.7 x 1.6 cm.  Left ovary:    Measures 4.1 x 2.8 x 4.5 cm and is notable for a 3.3 x 2.2 x 3.0 cm cyst.  Pulsed Doppler evaluation demonstrates normal low-resistance arterial and venous waveforms in both ovaries.  IMPRESSION: 3.3 cm left ovarian cyst.  Normal sonographic appearance of the uterus and right ovary.  No sonographic evidence for ovarian torsion.  Original Report Authenticated By: Charline Bills, M.D.   US Pelvis Complete  03/05/2011  *RADIOLOGY REPORT*  Clinical Data:  Known left ovarian cyst, pain, evaluate for torsion  TRANSABDOMINAL AND TRANSVAGINAL ULTRASOUND OF PELVIS DOPPLER ULTRASOUND OF OVARIES  Technique:  Both transabdominal and transvaginal ultrasound examinations of the pelvis were performed. Transabdominal technique was performed for global imaging of the pelvis including uterus, ovaries, adnexal regions, and pelvic cul-de-sac.  It was necessary to proceed with endovaginal exam following the transabdominal exam to visualize the endometrium.  Color and duplex Doppler ultrasound was utilized to evaluate blood flow to the ovaries.  Comparison:  CT abdomen pelvis dated 03/05/2011  Findings:  Uterus:  Normal in size and appearance, measuring 4.9 x 2.7 x 4.0 cm.  Endometrium:  Normal in thickness and appearance, measuring 4 mm.  Right ovary: Normal appearance/no adnexal mass, measuring 2.6 x 1.7 x 1.6 cm.  Left ovary:   Measures 4.1 x 2.8 x 4.5 cm and is notable for a 3.3 x 2.2 x 3.0 cm cyst.  Pulsed Doppler evaluation demonstrates normal low-resistance arterial and venous waveforms in both ovaries.  IMPRESSION: 3.3 cm left ovarian cyst.  Normal sonographic appearance of the uterus and right ovary.  No sonographic evidence for ovarian torsion.  Original Report Authenticated By: Charline Bills, M.D.   US Abdomen Limited  02/27/2011  *RADIOLOGY REPORT*  Clinical Data: Left upper quadrant pain, evaluate size of the spleen  LIMITED ABDOMINAL ULTRASOUND  Comparison:  None.  Findings: The spleen measures 10.7 x 11.6 x 5.1  cm with a total volume of 334 ml.  This splenic volume is within normal limits.  No splenic lesion is seen.  IMPRESSION: The spleen is within normal limits in size.  Original Report Authenticated By: Juline Patch, M.D.   [place] Art/ven Flow Abd Pelv Doppler  03/05/2011  *RADIOLOGY REPORT*  Clinical Data:  Known left ovarian cyst, pain, evaluate for torsion  TRANSABDOMINAL AND TRANSVAGINAL ULTRASOUND OF PELVIS DOPPLER ULTRASOUND OF OVARIES  Technique:  Both transabdominal and transvaginal ultrasound examinations of the pelvis were performed. Transabdominal technique was performed for global imaging of the pelvis including uterus, ovaries, adnexal regions, and pelvic cul-de-sac.  It was necessary to proceed with endovaginal exam following the transabdominal exam to visualize the endometrium.  Color and duplex Doppler ultrasound was utilized to evaluate blood flow to the ovaries.  Comparison:  CT abdomen pelvis dated 03/05/2011  Findings:  Uterus:  Normal in size and appearance, measuring 4.9 x 2.7 x 4.0 cm.  Endometrium:  Normal in thickness and appearance, measuring 4 mm.  Right ovary: Normal appearance/no adnexal mass, measuring 2.6 x 1.7 x 1.6 cm.  Left ovary:   Measures 4.1 x 2.8 x 4.5 cm and is notable for a 3.3 x 2.2 x 3.0 cm cyst.  Pulsed Doppler evaluation demonstrates normal low-resistance arterial and venous waveforms in both ovaries.  IMPRESSION: 3.3 cm left ovarian cyst.  Normal sonographic appearance of the uterus and right ovary.  No sonographic evidence for ovarian torsion.  Original Report Authenticated By: Charline Bills, M.D.     [place] results reviewed. No evidence of torsion. Discussed results with pt and father. She will be discharged home and advised to f/u with GYN for continued pain.   [address] [place], [place] 03/05/11 [phone]

## 2011-03-05 NOTE — ED Notes (Signed)
MD at bedside. 

## 2011-03-05 NOTE — ED Provider Notes (Signed)
History     CSN: [id number]  Arrival date & time 03/05/11  0414   First MD Initiated Contact with Patient 03/05/11 [phone]      Chief Complaint  Patient presents with  . Abdominal Pain    LLQ     HPI  History provided by the patient. Patient is a 26 year old female who presents with complaints of left upper abdomen and flank pains that increased for the past 5-6 hours. Patient reports having similar symptoms the past several days. She reports symptoms seem to always become much more severe late at night. Patient also reports having a recent diagnosis of mononucleosis last Monday. Patient reports being evaluated for these symptoms here in the emergency room as well as by her PCP. She was told by her PCP that she may have a UTI and was given a prescription for antibiotics. She was also told her she may have a kidney stone. Patient has been taking Percocet for her symptoms and reports taking 3 throughout the course of the evening last night. Medicine has helped some and currently pain seems to be subsiding more. Pain radiates some to the left lower quadrant. Patient denies having any dysuria, hematuria or urinary frequency. She denies any vaginal bleeding vaginal discharge.    Past Medical History  Diagnosis Date  . Dysmenorrhea   . Fibrocystic breast disease   . Allergy     seasonal    History reviewed. No pertinent past surgical history.  Family History  Problem Relation Age of Onset  . Thyroid disease Mother   . Hyperlipidemia Mother   . Hyperlipidemia Father   . Vision loss Father     blind in L eye (?CVA?)  . Diabetes Maternal Uncle   . Cancer Paternal Grandfather     bladder cancer  . Hyperlipidemia Sister     History  Substance Use Topics  . Smoking status: Never Smoker   . Smokeless tobacco: Never Used  . Alcohol Use: Yes     socially 3-4 drinks on weekends    OB History    Grav Para Term Preterm Abortions TAB SAB Ect Mult Living                  Review of  Systems  Constitutional: Negative for fever and chills.  Respiratory: Negative for cough and shortness of breath.   Cardiovascular: Negative for chest pain.  Gastrointestinal: Positive for nausea and abdominal pain. Negative for vomiting, diarrhea and constipation.  Genitourinary: Positive for flank pain. Negative for dysuria, frequency, hematuria, vaginal bleeding and vaginal discharge.  All other systems reviewed and are negative.    Allergies  Review of patient's allergies indicates no known allergies.  Home Medications   Current Outpatient Rx  Name Route Sig Dispense Refill  . CIPROFLOXACIN HCL 500 MG PO TABS Oral Take 1 tablet (500 mg total) by mouth 2 (two) times daily. 14 tablet 0  . IBUPROFEN 200 MG PO TABS Oral Take 600 mg by mouth every 8 (eight) hours as needed. For pain.    Marland Kitchen LEVONORGESTREL-ETHINYL ESTRAD 0.1-20 MG-MCG PO TABS Oral Take 1 tablet by mouth daily. 28 tablet 11  . ONDANSETRON HCL 4 MG PO TABS Oral Take 1 tablet (4 mg total) by mouth every 6 (six) hours. 15 tablet 0  . OXYCODONE-ACETAMINOPHEN 7.5-500 MG PO TABS Oral Take 1 tablet by mouth every 4 (four) hours as needed for pain. 20 tablet 0    BP 119/58  Pulse 84  Temp(Src) 97.9 F (  36.6 C) (Oral)  Resp 18  Wt 180 lb (81.647 kg)  SpO2 100%  LMP 02/16/2011  Physical Exam  Nursing note and vitals reviewed. Constitutional: She is oriented to person, place, and time. She appears well-developed and well-nourished. No distress.  HENT:  Head: Normocephalic and atraumatic.  Cardiovascular: Normal rate and regular rhythm.   Pulmonary/Chest: Effort normal and breath sounds normal.  Abdominal: Soft. She exhibits no distension. There is tenderness in the suprapubic area, left upper quadrant and left lower quadrant. There is no rebound, no guarding, no CVA tenderness, no tenderness at McBurney's point and negative Murphy's sign.  Neurological: She is alert and oriented to person, place, and time.  Skin: Skin is  warm and dry. No rash noted.  Psychiatric: She has a normal mood and affect. Her behavior is normal.    ED Course  Procedures    Labs Reviewed  URINALYSIS, ROUTINE W REFLEX MICROSCOPIC  CBC  DIFFERENTIAL  I-STAT, CHEM 8   Results for orders placed during the hospital encounter of 03/05/11  URINALYSIS, ROUTINE W REFLEX MICROSCOPIC      Component Value Range   Color, Urine YELLOW  YELLOW    APPearance CLOUDY (*) CLEAR    Specific Gravity, Urine 1.022  1.005 - 1.030    pH 5.5  5.0 - 8.0    Glucose, UA NEGATIVE  NEGATIVE (mg/dL)   Hgb urine dipstick LARGE (*) NEGATIVE    Bilirubin Urine NEGATIVE  NEGATIVE    Ketones, ur NEGATIVE  NEGATIVE (mg/dL)   Protein, ur NEGATIVE  NEGATIVE (mg/dL)   Urobilinogen, UA 0.2  0.0 - 1.0 (mg/dL)   Nitrite NEGATIVE  NEGATIVE    Leukocytes, UA TRACE (*) NEGATIVE   URINE MICROSCOPIC-ADD ON      Component Value Range   Squamous Epithelial / LPF RARE  RARE    WBC, UA 0-2  <3 (WBC/hpf)   RBC / HPF TOO NUMEROUS TO COUNT  <3 (RBC/hpf)   Bacteria, UA MANY (*) RARE    Urine-Other MUCOUS PRESENT    POCT I-STAT, CHEM 8      Component Value Range   Sodium 138  135 - 145 (mEq/L)   Potassium 3.9  3.5 - 5.1 (mEq/L)   Chloride 103  96 - 112 (mEq/L)   BUN 8  6 - 23 (mg/dL)   Creatinine, Ser 4.69  0.50 - 1.10 (mg/dL)   Glucose, Bld 95  70 - 99 (mg/dL)   Calcium, Ion 6.29  5.28 - 1.32 (mmol/L)   TCO2 25  0 - 100 (mmol/L)   Hemoglobin 13.9  12.0 - 15.0 (g/dL)   HCT 41.3  24.4 - 01.0 (%)     No results found.   No diagnosis found.    MDM  4:45 AM patient seen and evaluated. Patient in no acute distress.  5:00 AM patient was signs for concerning for possible left kidney stone. Patient had normal ultrasound of spleen performed on January 18. Patient's symptoms of severe pain have been intermittent for the past several days. Patient does not have any peritoneal signs on abdomen. Basic labs and CT scan pending.  5:30 AM patient feeling much better  after pain medications. Urine shows too numerous to count red blood cells. I-STAT shows normal renal function. CBC and CT scan of abdomen are still pending.  6:00 AM patient discussed in sign out with Amedeo Gory PAC.  She will follow CBC and CT scan results and make appropriate disposition.     Theron Arista  [place] Dragon, PA 03/05/11 [phone]

## 2011-03-05 NOTE — ED Provider Notes (Signed)
Medical screening examination/treatment/procedure(s) were performed by non-physician practitioner and as supervising physician I was immediately available for consultation/collaboration.   Hanley Seamen, MD 03/05/11 [phone]

## 2011-03-05 NOTE — ED Notes (Signed)
Pt alert, c/o llq abd pain, seen in ED several times, recently diagnosed with "mono", treated by PCP for UTI, returns with cont pain after taking prescribed pain medications today,

## 2011-03-05 NOTE — ED Notes (Signed)
Patient transported to X-ray 

## 2011-03-06 ENCOUNTER — Other Ambulatory Visit: Payer: Self-pay | Admitting: Obstetrics & Gynecology

## 2011-03-06 ENCOUNTER — Encounter (HOSPITAL_COMMUNITY): Payer: Self-pay | Admitting: *Deleted

## 2011-03-06 LAB — URINE CULTURE
Colony Count: NO GROWTH
Culture  Setup Time: 201301241037
Culture: NO GROWTH

## 2011-03-06 NOTE — H&P (Addendum)
Catherine Hill is an 26 y.o. female.  G0. Seen in office on 03/05/11 after ED visit at [facility] for severe LLQ pain for >1 wk that got worse and hence went to ED. CT and sono done. Noted to have 3.3 cm left ovarian cyst with no evidence of torsion. Patient called back on 1/25 am with more pain requesting to proceed with surgery since pain meds(ibuprofen and percocet) not helping even though cyst is small and unlikely to cause torsion and more likely to resolve with time (few wks) Has been sick off and on since Nov'12, recent Infectious mononucleosis diagnosis, but spleen normal.  On OCs, not missed any. Amox course in Dec, Cipro now for UTI.  No bowel problems.   No STDs. No PID hx.  Not currently sexually active. Normal Paps in past. Menses normal.    Past Medical History  Diagnosis Date  . Dysmenorrhea   . Fibrocystic breast disease   . Allergy     seasonal   Past Surgical History  Procedure Date  . Wisdom tooth extraction    Family History  Problem Relation Age of Onset  . Thyroid disease Mother   . Hyperlipidemia Mother   . Hyperlipidemia Father   . Vision loss Father     blind in L eye (?CVA?)  . Diabetes Maternal Uncle   . Cancer Paternal Grandfather     bladder cancer  . Hyperlipidemia Sister    Social History:  reports that she has never smoked. She has never used smokeless tobacco. She reports that she drinks alcohol. She reports that she does not use illicit drugs.  Allergies: No Known Allergies  No prescriptions prior to admission   Review of Systems  Constitutional: Negative for fever and chills.  Respiratory: Negative for cough and hemoptysis.   Gastrointestinal: Positive for abdominal pain.  Genitourinary: Negative for dysuria and flank pain.  Musculoskeletal: Positive for back pain.   Last menstrual period 02/16/2011.  Physical Exam A&O x 3, no acute distress. Pleasant HEENT neg, no thyromegaly Lungs CTA bilat CV RRR, S1S2 normal Abdo soft, non tender, non  acute Extr no edema/ tenderness Pelvic left adnexal fulless, mild tenderness and left hip tenderness, no flank or CVA tenderness.   Labs- reviewed, nl platelets and CBC.  CT - normal spleen. Rest reviewed.  Pelvic sono with Dopplers-Normal uterus, left ovary slightly larger than right ovary with 3.3 cm cyst and normal ovarian blood flow. No free fluid in pelvis.   Assessment/Plan: Left adnexal/LLQ/left back pain for 1 wk in patient with small 3.3 cm ovarian cyst without radiologic evidence of torsion. Pt in more pain than what to expect from the size of the cyst, not getting relief with pain meds. Hence proceed with laparoscopic eval and possible ovarian cystectomy vs drainage if appears follicular with minimal intervention so as to preserve the ovary. Risks/complications incl infection, bleeding, damage to internal organs and risks of long term complications incl respiratory, VTE etc reviewed. Also reviewed risks from general anesthesia. She understands and agrees.   Rakia Frayne R 03/06/2011, 7:21 PM  Addendum-- 03/07/11.  Reviewed, no changes. --V.Juliene Pina, MD

## 2011-03-07 ENCOUNTER — Encounter (HOSPITAL_COMMUNITY): Payer: Self-pay | Admitting: Anesthesiology

## 2011-03-07 ENCOUNTER — Ambulatory Visit (HOSPITAL_COMMUNITY): Payer: 59 | Admitting: Anesthesiology

## 2011-03-07 ENCOUNTER — Encounter (HOSPITAL_COMMUNITY): Payer: Self-pay | Admitting: General Surgery

## 2011-03-07 ENCOUNTER — Ambulatory Visit (HOSPITAL_COMMUNITY)
Admission: RE | Admit: 2011-03-07 | Discharge: 2011-03-07 | Disposition: A | Discharge status: Home / Self Care | Payer: 59 | Source: Ambulatory Visit | Attending: Obstetrics & Gynecology | Admitting: Obstetrics & Gynecology

## 2011-03-07 ENCOUNTER — Encounter (HOSPITAL_COMMUNITY)
Admission: RE | Disposition: A | Discharge status: Home / Self Care | Payer: Self-pay | Source: Ambulatory Visit | Attending: Obstetrics & Gynecology

## 2011-03-07 DIAGNOSIS — N83209 Unspecified ovarian cyst, unspecified side: Secondary | ICD-10-CM | POA: Insufficient documentation

## 2011-03-07 DIAGNOSIS — N8300 Follicular cyst of ovary, unspecified side: Secondary | ICD-10-CM | POA: Insufficient documentation

## 2011-03-07 DIAGNOSIS — S3760XA Unspecified injury of uterus, initial encounter: Secondary | ICD-10-CM | POA: Insufficient documentation

## 2011-03-07 DIAGNOSIS — R1032 Left lower quadrant pain: Secondary | ICD-10-CM | POA: Insufficient documentation

## 2011-03-07 HISTORY — PX: LAPAROSCOPY: SHX197

## 2011-03-07 LAB — URINE CULTURE

## 2011-03-07 LAB — CBC
Hemoglobin: 12.5 g/dL (ref 12.0–15.0)
MCV: 88.9 fL (ref 78.0–100.0)
Platelets: 169 10*3/uL (ref 150–400)
RBC: 4.14 MIL/uL (ref 3.87–5.11)
WBC: 4.7 10*3/uL (ref 4.0–10.5)

## 2011-03-07 LAB — SURGICAL PCR SCREEN: MRSA, PCR: NEGATIVE

## 2011-03-07 SURGERY — LAPAROSCOPY OPERATIVE
Anesthesia: General | Site: Abdomen | Laterality: Left | Wound class: Clean Contaminated

## 2011-03-07 MED ORDER — KETOROLAC TROMETHAMINE 30 MG/ML IJ SOLN
INTRAMUSCULAR | Status: AC
  Filled 2011-03-07: qty 1

## 2011-03-07 MED ORDER — METOCLOPRAMIDE HCL 5 MG/ML IJ SOLN: 10.0000 mg | Freq: Once | INTRAMUSCULAR | Status: DC | PRN

## 2011-03-07 MED ORDER — PROPOFOL 10 MG/ML IV EMUL
INTRAVENOUS | Status: DC | PRN
  Administered 2011-03-07: 200 mg via INTRAVENOUS

## 2011-03-07 MED ORDER — DEXAMETHASONE SODIUM PHOSPHATE 10 MG/ML IJ SOLN
INTRAMUSCULAR | Status: DC | PRN
  Administered 2011-03-07: 10 mg via INTRAVENOUS

## 2011-03-07 MED ORDER — HEPARIN SODIUM (PORCINE) 5000 UNIT/ML IJ SOLN: INTRAMUSCULAR | Status: DC | PRN

## 2011-03-07 MED ORDER — FENTANYL CITRATE 0.05 MG/ML IJ SOLN
INTRAMUSCULAR | Status: AC
  Filled 2011-03-07: qty 2

## 2011-03-07 MED ORDER — BUPIVACAINE HCL (PF) 0.25 % IJ SOLN
INTRAMUSCULAR | Status: DC | PRN
  Administered 2011-03-07: 10 mL

## 2011-03-07 MED ORDER — ROCURONIUM BROMIDE 100 MG/10ML IV SOLN
INTRAVENOUS | Status: DC | PRN
  Administered 2011-03-07: 10 mg via INTRAVENOUS
  Administered 2011-03-07: 40 mg via INTRAVENOUS

## 2011-03-07 MED ORDER — LIDOCAINE HCL (CARDIAC) 20 MG/ML IV SOLN
INTRAVENOUS | Status: DC | PRN
  Administered 2011-03-07: 60 mg via INTRAVENOUS

## 2011-03-07 MED ORDER — SODIUM CHLORIDE 0.9 % IJ SOLN
INTRAMUSCULAR | Status: DC | PRN
  Administered 2011-03-07: 4 mL

## 2011-03-07 MED ORDER — NEOSTIGMINE METHYLSULFATE 1 MG/ML IJ SOLN
INTRAMUSCULAR | Status: DC | PRN
  Administered 2011-03-07: 3 mg via INTRAVENOUS

## 2011-03-07 MED ORDER — LACTATED RINGERS IV SOLN
INTRAVENOUS | Status: DC
  Administered 2011-03-07 (×2): via INTRAVENOUS

## 2011-03-07 MED ORDER — PROPOFOL 10 MG/ML IV EMUL
INTRAVENOUS | Status: AC
  Filled 2011-03-07: qty 20

## 2011-03-07 MED ORDER — MUPIROCIN 2 % EX OINT
TOPICAL_OINTMENT | Freq: Two times a day (BID) | CUTANEOUS | Status: DC
  Administered 2011-03-07: 1 via NASAL

## 2011-03-07 MED ORDER — FENTANYL CITRATE 0.05 MG/ML IJ SOLN
25.0000 ug | INTRAMUSCULAR | Status: DC | PRN
  Administered 2011-03-07: 50 ug via INTRAVENOUS

## 2011-03-07 MED ORDER — ROCURONIUM BROMIDE 50 MG/5ML IV SOLN
INTRAVENOUS | Status: AC
  Filled 2011-03-07: qty 1

## 2011-03-07 MED ORDER — ONDANSETRON HCL 4 MG/2ML IJ SOLN
INTRAMUSCULAR | Status: AC
  Filled 2011-03-07: qty 2

## 2011-03-07 MED ORDER — KETOROLAC TROMETHAMINE 30 MG/ML IJ SOLN
INTRAMUSCULAR | Status: DC | PRN
  Administered 2011-03-07: 30 mg via INTRAVENOUS

## 2011-03-07 MED ORDER — DEXAMETHASONE SODIUM PHOSPHATE 10 MG/ML IJ SOLN
INTRAMUSCULAR | Status: AC
  Filled 2011-03-07: qty 1

## 2011-03-07 MED ORDER — OXYCODONE-ACETAMINOPHEN 5-325 MG PO TABS: 1.0000 | ORAL_TABLET | ORAL | Status: AC | PRN

## 2011-03-07 MED ORDER — BUPIVACAINE HCL (PF) 0.25 % IJ SOLN
INTRAMUSCULAR | Status: AC
  Filled 2011-03-07: qty 30

## 2011-03-07 MED ORDER — MUPIROCIN 2 % EX OINT
TOPICAL_OINTMENT | CUTANEOUS | Status: AC
  Administered 2011-03-07: 1 via NASAL
  Filled 2011-03-07: qty 22

## 2011-03-07 MED ORDER — FENTANYL CITRATE 0.05 MG/ML IJ SOLN
INTRAMUSCULAR | Status: DC | PRN
  Administered 2011-03-07 (×3): 50 ug via INTRAVENOUS
  Administered 2011-03-07: 100 ug via INTRAVENOUS

## 2011-03-07 MED ORDER — FENTANYL CITRATE 0.05 MG/ML IJ SOLN
INTRAMUSCULAR | Status: AC
  Filled 2011-03-07: qty 5

## 2011-03-07 MED ORDER — GLYCOPYRROLATE 0.2 MG/ML IJ SOLN
INTRAMUSCULAR | Status: AC
  Filled 2011-03-07: qty 1

## 2011-03-07 MED ORDER — MIDAZOLAM HCL 5 MG/5ML IJ SOLN
INTRAMUSCULAR | Status: DC | PRN
  Administered 2011-03-07: 2 mg via INTRAVENOUS

## 2011-03-07 MED ORDER — MEPERIDINE HCL 25 MG/ML IJ SOLN: 6.2500 mg | INTRAMUSCULAR | Status: DC | PRN

## 2011-03-07 MED ORDER — MIDAZOLAM HCL 2 MG/2ML IJ SOLN
INTRAMUSCULAR | Status: AC
  Filled 2011-03-07: qty 2

## 2011-03-07 MED ORDER — ONDANSETRON HCL 4 MG/2ML IJ SOLN
INTRAMUSCULAR | Status: DC | PRN
  Administered 2011-03-07: 4 mg via INTRAVENOUS

## 2011-03-07 MED ORDER — GLYCOPYRROLATE 0.2 MG/ML IJ SOLN
INTRAMUSCULAR | Status: DC | PRN
  Administered 2011-03-07: .4 mg via INTRAVENOUS

## 2011-03-07 MED ORDER — LIDOCAINE HCL (CARDIAC) 20 MG/ML IV SOLN
INTRAVENOUS | Status: AC
  Filled 2011-03-07: qty 5

## 2011-03-07 MED ORDER — NEOSTIGMINE METHYLSULFATE 1 MG/ML IJ SOLN
INTRAMUSCULAR | Status: AC
  Filled 2011-03-07: qty 10

## 2011-03-07 SURGICAL SUPPLY — 32 items
ADH SKN CLS APL DERMABOND .7 (GAUZE/BANDAGES/DRESSINGS) ×1
BAG SPEC RTRVL LRG 6X4 10 (ENDOMECHANICALS)
CABLE HIGH FREQUENCY MONO STRZ (ELECTRODE) ×1 IMPLANT
CATH ROBINSON RED A/P 16FR (CATHETERS) ×2 IMPLANT
CHLORAPREP W/TINT 26ML (MISCELLANEOUS) ×2 IMPLANT
CLOTH BEACON ORANGE TIMEOUT ST (SAFETY) ×2 IMPLANT
DERMABOND ADVANCED (GAUZE/BANDAGES/DRESSINGS) ×1
DERMABOND ADVANCED .7 DNX12 (GAUZE/BANDAGES/DRESSINGS) ×1 IMPLANT
EVACUATOR SMOKE 8.L (FILTER) IMPLANT
FORCEPS CUTTING 33CM 5MM (CUTTING FORCEPS) IMPLANT
FORCEPS CUTTING 45CM 5MM (CUTTING FORCEPS) IMPLANT
GLOVE BIO SURGEON STRL SZ7 (GLOVE) ×2 IMPLANT
GLOVE BIOGEL PI IND STRL 7.0 (GLOVE) ×2 IMPLANT
GLOVE BIOGEL PI INDICATOR 7.0 (GLOVE) ×2
GOWN PREVENTION PLUS LG XLONG (DISPOSABLE) ×5 IMPLANT
MANIPULATOR UTERINE 4.5 ZUMI (MISCELLANEOUS) ×2 IMPLANT
NDL INSUFFLATION 14GA 120MM (NEEDLE) IMPLANT
NEEDLE INSUFFLATION 14GA 120MM (NEEDLE) ×2 IMPLANT
NS IRRIG 1000ML POUR BTL (IV SOLUTION) ×2 IMPLANT
PACK LAPAROSCOPY BASIN (CUSTOM PROCEDURE TRAY) ×2 IMPLANT
POUCH SPECIMEN RETRIEVAL 10MM (ENDOMECHANICALS) IMPLANT
SCISSORS LAP 5X35 DISP (ENDOMECHANICALS) IMPLANT
SET IRRIG TUBING LAPAROSCOPIC (IRRIGATION / IRRIGATOR) IMPLANT
SOLUTION ELECTROLUBE (MISCELLANEOUS) IMPLANT
SUT VICRYL 0 UR6 27IN ABS (SUTURE) ×2 IMPLANT
SUT VICRYL 4-0 PS2 18IN ABS (SUTURE) ×2 IMPLANT
TOWEL OR 17X24 6PK STRL BLUE (TOWEL DISPOSABLE) ×4 IMPLANT
TROCAR BALLN 12MMX100 BLUNT (TROCAR) IMPLANT
TROCAR XCEL NON-BLD 11X100MML (ENDOMECHANICALS) IMPLANT
TROCAR XCEL NON-BLD 5MMX100MML (ENDOMECHANICALS) ×1 IMPLANT
WARMER LAPAROSCOPE (MISCELLANEOUS) ×2 IMPLANT
WATER STERILE IRR 1000ML POUR (IV SOLUTION) ×1 IMPLANT

## 2011-03-07 NOTE — Preoperative (Signed)
Beta Blockers   Reason not to administer Beta Blockers:Not Applicable 

## 2011-03-07 NOTE — Anesthesia Postprocedure Evaluation (Signed)
  Anesthesia Post-op Note  Patient: Catherine Hill  Procedure(s) Performed:  LAPAROSCOPY OPERATIVE - Diagnostic laparoscopy and drainage of left ovarian cyst. Repair of cervical laceration.  Patient Location: PACU  Anesthesia Type: General  Level of Consciousness: awake, alert  and oriented  Airway and Oxygen Therapy: Patient Spontanous Breathing  Post-op Pain: mild  Post-op Assessment: Post-op Vital signs reviewed, Patient's Cardiovascular Status Stable, Respiratory Function Stable, Patent Airway, No signs of Nausea or vomiting and Pain level controlled  Post-op Vital Signs: Reviewed and stable  Complications: No apparent anesthesia complications

## 2011-03-07 NOTE — Op Note (Signed)
Preoperative diagnosis:Left ovarian cyst, pelvic pain.  Postoperative diagnosis: Same, Follicular cyst of left ovary                                          1cm Laceration of cervix at tenaculum site Procedure: Laparoscopic left ovarian cyst drainage                    Cervical laceration stitch (tenaculum site) Surgeon: Dr Shea Evans, MD Assistants: none Anesthesia Gen. Endotracheal IV fluids LR EBL minimal Urine clear (straight cath pre-op)  Complications none Disposition PACU and home Specimens none  Procedure Patient is 26 yo, G0, with 1 week history of acute left lower abdomen/back/pelvic pain. Was seen in ER for severe pain and tried Ibuprofen and Percocet without much relief. Sonography noted 3.3 cm simple ovarian cyst of left ovary with normal Doppler flow to the ovary and uterus and right ovary appeared normal. Patient was advised expectant management since appeared to be a functional ovarian cyst (although she's been on oral contraceptive with recent 2 courses of antibiotcs from URI and UTI). Patient called back office on 03/06/11 with worsening pain desiring to proceed with surgery. Risk and complications of surgery including infection, bleeding, damage to internal organs, other complications including pneumonia, VTE were reviewed. Patient voiced understanding. Informed written consent was obtained and patient was brought to the operating room with IV running. Timeout was carried out. She underwent general anesthesia without difficulty and was given dorsal lithotomy position with left arm tucked. Patient was prepped and draped in standard fashion. Bladder was emptied with straight catheter once with clear urine. Speculum was placed anterior lip of cervix was grasped with tenaculum, uterus was sounded to 8 cm and was anteverted with tight internal os (since G0). Rubin canula with acorn tip was used for manipulation since [employer] could not be advanced.  Gloves were changed,  attention was focused on the abdomen. A  5 mm vertical incision was made at the umbilicus after injecting 0.25% Marcaine. Abdominal wall was tented and Veress needle was introduced at a 45' angle. Intra-abdominal placement confirmed with saline drop and low opening pressures when CO2 insufflation was begun. Pneumoperitoneum pressure was brought to 16 mm and Veress removed and a 5 mm disposable trocar/cannula was introduced. Gas release confirmed and then 5 mm  0 laparoscope was introduced. No abdominal injury noted. Patient was given [place] position.  Internal organs appear normal including normal bowels, uterus, tubes, liver and right ovary. Left ovary was slightly enlarged about 4 cm, noted in left ovarian fossa. No evidence of adhesions or endoemetriosis noted. Ovary did not have any large cyst on the surface and was generally enlarged with a relatively clearing on its under surface.  5 mm incision made after marcaine injection in LLQ under vision and 5 mm trocar introduced. Attempt made to drain cyst with laparoscopic needle but ovarian wall was tough. So, Endoscissors with monopolar cautery introduced and cyst drainage was performed from a relatively avascular area. Clear fluid drained (no more than 10 cc). Bleeding from the cut edge cauterized. Saline irrigation performed. Hemostasis at ovarian puncture site was excellent. Pneumoperitoneum dropped, hemostasis was excellent. All instruments were removed under vision and CO2 deflated. The skin was approximated using 3-0 Vicryl in subcuticular fashion. Dermabond was applied at the incision.  Rubin canula removed, anterior lip of cervix at tenaculum site noted  1 cm laceration with active bleeding, hence figure of 8 stitch taken with 0-Vicryl to secure that. Hemostasis was excellent.   All counts were correct x2. Patient was reversed from general anesthesia, extubated and brought out to the recovery room in stable condition. No complications.  Surgical  findings were discussed with patient's family. Followup with Dr. Juliene Pina in office in 2 weeks.

## 2011-03-07 NOTE — Transfer of Care (Signed)
Immediate Anesthesia Transfer of Care Note  Patient: Catherine Hill  Procedure(s) Performed:  LAPAROSCOPY OPERATIVE - Diagnostic laparoscopy and drainage of left ovarian cyst. Repair of cervical laceration.  Patient Location: PACU  Anesthesia Type: General  Level of Consciousness: awake, alert  and patient cooperative  Airway & Oxygen Therapy: Patient Spontanous Breathing and Patient connected to nasal cannula oxygen  Post-op Assessment: Report given to PACU RN  Post vital signs: Reviewed  Complications: No apparent anesthesia complications

## 2011-03-07 NOTE — Anesthesia Preprocedure Evaluation (Signed)
Anesthesia Evaluation  Patient identified by MRN, date of birth, ID band Patient awake    Reviewed: Allergy & Precautions, H&P , NPO status , Patient's Chart, lab work & pertinent test results  Airway Mallampati: III TM Distance: >3 FB Neck ROM: full    Dental No notable dental hx. (+) Teeth Intact   Pulmonary neg pulmonary ROS,  clear to auscultation  Pulmonary exam normal       Cardiovascular neg cardio ROS regular Normal    Neuro/Psych Negative Neurological ROS  Negative Psych ROS   GI/Hepatic negative GI ROS, Neg liver ROS,   Endo/Other  Negative Endocrine ROS  Renal/GU negative Renal ROS  Genitourinary negative   Musculoskeletal   Abdominal Normal abdominal exam  (+)   Peds  Hematology negative hematology ROS (+)   Anesthesia Other Findings   Reproductive/Obstetrics negative OB ROS                           Anesthesia Physical Anesthesia Plan  ASA: II  Anesthesia Plan: General ETT   Post-op Pain Management:    Induction:   Airway Management Planned:   Additional Equipment:   Intra-op Plan:   Post-operative Plan:   Informed Consent: I have reviewed the patients History and Physical, chart, labs and discussed the procedure including the risks, benefits and alternatives for the proposed anesthesia with the patient or authorized representative who has indicated his/her understanding and acceptance.     Plan Discussed with: Anesthesiologist, CRNA and Surgeon  Anesthesia Plan Comments:         Anesthesia Quick Evaluation

## 2011-03-09 ENCOUNTER — Encounter (HOSPITAL_COMMUNITY): Payer: Self-pay | Admitting: Obstetrics & Gynecology

## 2011-04-27 ENCOUNTER — Encounter (HOSPITAL_COMMUNITY): Payer: Self-pay

## 2011-05-01 ENCOUNTER — Other Ambulatory Visit: Payer: Self-pay | Admitting: Surgery

## 2011-06-02 ENCOUNTER — Telehealth: Payer: Self-pay | Admitting: Internal Medicine

## 2011-06-02 DIAGNOSIS — N946 Dysmenorrhea, unspecified: Secondary | ICD-10-CM

## 2011-06-02 MED ORDER — LEVONORGESTREL-ETHINYL ESTRAD 0.1-20 MG-MCG PO TABS: 1.0000 | ORAL_TABLET | Freq: Every day | ORAL | Status: DC

## 2011-06-02 NOTE — Telephone Encounter (Signed)
rx was done 07/09/10 with 11 refills, which should last a year (she shouldn't be out).  She needs to schedule CPE/pap.  Check with pharmacy--if needed, okay to refill x2. Please schedule CPE

## 2011-06-02 NOTE — Telephone Encounter (Signed)
Pt schedule physical w/ pap for June 5

## 2011-06-02 NOTE — Telephone Encounter (Signed)
Called pharmacy and said that aviane was filled in July with 10 refills and that pt is out and that's why the request was sent. So i told pharmacist to do it with 2 more refills. And tried to call pt but left a message. If pt calls back -- Pt needs to make an appt for a cpe/pap.

## 2011-07-15 ENCOUNTER — Encounter: Payer: Self-pay | Admitting: Family Medicine

## 2011-07-15 ENCOUNTER — Ambulatory Visit (INDEPENDENT_AMBULATORY_CARE_PROVIDER_SITE_OTHER): Payer: 59 | Admitting: Family Medicine

## 2011-07-15 VITALS — BP 104/62 | HR 64 | Ht 64.25 in | Wt 180.0 lb

## 2011-07-15 DIAGNOSIS — E7800 Pure hypercholesterolemia, unspecified: Secondary | ICD-10-CM

## 2011-07-15 DIAGNOSIS — Z0000 Encounter for general adult medical examination without abnormal findings: Secondary | ICD-10-CM

## 2011-07-15 DIAGNOSIS — M7711 Lateral epicondylitis, right elbow: Secondary | ICD-10-CM

## 2011-07-15 DIAGNOSIS — M7710 Lateral epicondylitis, unspecified elbow: Secondary | ICD-10-CM

## 2011-07-15 LAB — POCT URINALYSIS DIPSTICK
Bilirubin, UA: NEGATIVE
Glucose, UA: NEGATIVE
Ketones, UA: NEGATIVE
Leukocytes, UA: NEGATIVE
pH, UA: 6

## 2011-07-15 LAB — LIPID PANEL
Cholesterol: 152 mg/dL (ref 0–200)
HDL: 37 mg/dL — ABNORMAL LOW (ref >39)
Total CHOL/HDL Ratio: 4.1 Ratio
Triglycerides: 120 mg/dL (ref <150)

## 2011-07-15 NOTE — Patient Instructions (Addendum)
HEALTH MAINTENANCE RECOMMENDATIONS:  It is recommended that you get at least 30 minutes of aerobic exercise at least 5 days/week (for weight loss, you may need as much as 60-90 minutes). This can be any activity that gets your heart rate up. This can be divided in 10-15 minute intervals if needed, but try and build up your endurance at least once a week.  Weight bearing exercise is also recommended twice weekly.  Eat a healthy diet with lots of vegetables, fruits and fiber.  "Colorful" foods have a lot of vitamins (ie green vegetables, tomatoes, red peppers, etc).  Limit sweet tea, regular sodas and alcoholic beverages, all of which has a lot of calories and sugar.  Up to 1 alcoholic drink daily may be beneficial for women (unless trying to lose weight, watch sugars).  Drink a lot of water.  Calcium recommendations are 1200-1500 mg daily (1500 mg for postmenopausal women or women without ovaries), and vitamin D 1000 IU daily.  This should be obtained from diet and/or supplements (vitamins), and calcium should not be taken all at once, but in divided doses.  Monthly self breast exams and yearly mammograms for women over the age of 16 is recommended.  Sunscreen of at least SPF 30 should be used on all sun-exposed parts of the skin when outside between the hours of 10 am and 4 pm (not just when at beach or pool, but even with exercise, golf, tennis, and yard work!)  Use a sunscreen that says "broad spectrum" so it covers both UVA and UVB rays, and make sure to reapply every 1-2 hours.  Remember to change the batteries in your smoke detectors when changing your clock times in the spring and fall.  Use your seat belt every time you are in a car, and please drive safely and not be distracted with cell phones and texting while driving.  Try using Aleve twice daily for up to 2 weeks.  If not improving, you can actually take 2 aleve twice daily--take it with food, to protect your stomach.  Do NOT take  ibuprofen along with the Aleve.  Try tennis elbow band/strap while exercising.   Lateral Epicondylitis (Tennis Elbow) with Rehab Lateral epicondylitis involves inflammation and pain around the outer portion of the elbow. The pain is caused by inflammation of the tendons in the forearm that bring back (extend) the wrist. Lateral epicondylittis is also called tennis elbow, because it is very common in tennis players. However, it may occur in any individual who extends the wrist repetitively. If lateral epicondylitis is left untreated, it may become a chronic problem. SYMPTOMS   Pain, tenderness, and inflammation on the outer (lateral) side of the elbow.   Pain or weakness with gripping activities.   Pain that increases with wrist twisting motions (playing tennis, using a screwdriver, opening a door or a jar).   Pain with lifting objects, including a coffee cup.  CAUSES  Lateral epicondylitis is caused by inflammation of the tendons that extend the wrist. Causes of injury may include:  Repetitive stress and strain on the muscles and tendons that extend the wrist.   Sudden change in activity level or intensity.   Incorrect grip in racquet sports.   Incorrect grip size of racquet (often too large).   Incorrect hitting position or technique (usually backhand, leading with the elbow).   Using a racket that is too heavy.  RISK INCREASES WITH:  Sports or occupations that require repetitive and/or strenuous forearm and wrist movements (  tennis, squash, racquetball, carpentry).   Poor wrist and forearm strength and flexibility.   Failure to warm up properly before activity.   Resuming activity before healing, rehabilitation, and conditioning are complete.  PREVENTION   Warm up and stretch properly before activity.   Maintain physical fitness:   Strength, flexibility, and endurance.   Cardiovascular fitness.   Wear and use properly fitted equipment.   Learn and use proper  technique and have a coach correct improper technique.   Wear a tennis elbow (counterforce) brace.  PROGNOSIS  The course of this condition depends on the degree of the injury. If treated properly, acute cases (symptoms lasting less than 4 weeks) are often resolved in 2 to 6 weeks. Chronic (longer lasting cases) often resolve in 3 to 6 months, but may require physical therapy. RELATED COMPLICATIONS   Frequently recurring symptoms, resulting in a chronic problem. Properly treating the problem the first time decreases frequency of recurrence.   Chronic inflammation, scarring tendon degeneration, and partial tendon tear, requiring surgery.   Delayed healing or resolution of symptoms.  TREATMENT  Treatment first involves the use of ice and medicine, to reduce pain and inflammation. Strengthening and stretching exercises may help reduce discomfort, if performed regularly. These exercises may be performed at home, if the condition is an acute injury. Chronic cases may require a referral to a physical therapist for evaluation and treatment. Your caregiver may advise a corticosteroid injection, to help reduce inflammation. Rarely, surgery is needed. MEDICATION  If pain medicine is needed, nonsteroidal anti-inflammatory medicines (aspirin and ibuprofen), or other minor pain relievers (acetaminophen), are often advised.   Do not take pain medicine for 7 days before surgery.   Prescription pain relievers may be given, if your caregiver thinks they are needed. Use only as directed and only as much as you need.   Corticosteroid injections may be recommended. These injections should be reserved only for the most severe cases, because they can only be given a certain number of times.  HEAT AND COLD  Cold treatment (icing) should be applied for 10 to 15 minutes every 2 to 3 hours for inflammation and pain, and immediately after activity that aggravates your symptoms. Use ice packs or an ice massage.    Heat treatment may be used before performing stretching and strengthening activities prescribed by your caregiver, physical therapist, or athletic trainer. Use a heat pack or a warm water soak.  SEEK MEDICAL CARE IF: Symptoms get worse or do not improve in 2 weeks, despite treatment. EXERCISES  RANGE OF MOTION (ROM) AND STRETCHING EXERCISES - Epicondylitis, Lateral (Tennis Elbow) These exercises may help you when beginning to rehabilitate your injury. Your symptoms may go away with or without further involvement from your physician, physical therapist or athletic trainer. While completing these exercises, remember:   Restoring tissue flexibility helps normal motion to return to the joints. This allows healthier, less painful movement and activity.   An effective stretch should be held for at least 30 seconds.   A stretch should never be painful. You should only feel a gentle lengthening or release in the stretched tissue.  RANGE OF MOTION - Wrist Flexion, Active-Assisted  Extend your right / left elbow with your fingers pointing down.*   Gently pull the back of your hand towards you, until you feel a gentle stretch on the top of your forearm.   Hold this position for ____15-30______ seconds.  Repeat _______5-10___ times. Complete this exercise _____2_____ times per day.  *If  directed by your physician, physical therapist or athletic trainer, complete this stretch with your elbow bent, rather than extended. RANGE OF MOTION - Wrist Extension, Active-Assisted  Extend your right / left elbow and turn your palm upwards.*   Gently pull your palm and fingertips back, so your wrist extends and your fingers point more toward the ground.   You should feel a gentle stretch on the inside of your forearm.   Hold this position for __________ seconds.  Repeat __________ times. Complete this exercise __________ times per day. *If directed by your physician, physical therapist or athletic trainer,  complete this stretch with your elbow bent, rather than extended. STRETCH - Wrist Flexion  Place the back of your right / left hand on a tabletop, leaving your elbow slightly bent. Your fingers should point away from your body.   Gently press the back of your hand down onto the table by straightening your elbow. You should feel a stretch on the top of your forearm.   Hold this position for __________ seconds.  Repeat __________ times. Complete this stretch __________ times per day.  STRETCH - Wrist Extension   Place your right / left fingertips on a tabletop, leaving your elbow slightly bent. Your fingers should point backwards.   Gently press your fingers and palm down onto the table by straightening your elbow. You should feel a stretch on the inside of your forearm.   Hold this position for __________ seconds.  Repeat __________ times. Complete this stretch __________ times per day.  STRENGTHENING EXERCISES - Epicondylitis, Lateral (Tennis Elbow) These exercises may help you when beginning to rehabilitate your injury. They may resolve your symptoms with or without further involvement from your physician, physical therapist or athletic trainer. While completing these exercises, remember:   Muscles can gain both the endurance and the strength needed for everyday activities through controlled exercises.   Complete these exercises as instructed by your physician, physical therapist or athletic trainer. Increase the resistance and repetitions only as guided.   You may experience muscle soreness or fatigue, but the pain or discomfort you are trying to eliminate should never worsen during these exercises. If this pain does get worse, stop and make sure you are following the directions exactly. If the pain is still present after adjustments, discontinue the exercise until you can discuss the trouble with your caregiver.  STRENGTH - Wrist Flexors  Sit with your right / left forearm palm-up and  fully supported on a table or countertop. Your elbow should be resting below the height of your shoulder. Allow your wrist to extend over the edge of the surface.   Loosely holding a __________ weight, or a piece of rubber exercise band or tubing, slowly curl your hand up toward your forearm.   Hold this position for __________ seconds. Slowly lower the wrist back to the starting position in a controlled manner.  Repeat __________ times. Complete this exercise __________ times per day.  STRENGTH - Wrist Extensors  Sit with your right / left forearm palm-down and fully supported on a table or countertop. Your elbow should be resting below the height of your shoulder. Allow your wrist to extend over the edge of the surface.   Loosely holding a __________ weight, or a piece of rubber exercise band or tubing, slowly curl your hand up toward your forearm.   Hold this position for __________ seconds. Slowly lower the wrist back to the starting position in a controlled manner.  Repeat __________ times.  Complete this exercise __________ times per day.  STRENGTH - Ulnar Deviators  Stand with a ____________________ weight in your right / left hand, or sit while holding a rubber exercise band or tubing, with your healthy arm supported on a table or countertop.   Move your wrist, so that your pinkie travels toward your forearm and your thumb moves away from your forearm.   Hold this position for __________ seconds and then slowly lower the wrist back to the starting position.  Repeat __________ times. Complete this exercise __________ times per day STRENGTH - Radial Deviators  Stand with a ____________________ weight in your right / left hand, or sit while holding a rubber exercise band or tubing, with your injured arm supported on a table or countertop.   Raise your hand upward in front of you or pull up on the rubber tubing.   Hold this position for __________ seconds and then slowly lower the  wrist back to the starting position.  Repeat __________ times. Complete this exercise __________ times per day. STRENGTH - Forearm Supinators   Sit with your right / left forearm supported on a table, keeping your elbow below shoulder height. Rest your hand over the edge, palm down.   Gently grip a hammer or a soup ladle.   Without moving your elbow, slowly turn your palm and hand upward to a "thumbs-up" position.   Hold this position for __________ seconds. Slowly return to the starting position.  Repeat __________ times. Complete this exercise __________ times per day.  STRENGTH - Forearm Pronators   Sit with your right / left forearm supported on a table, keeping your elbow below shoulder height. Rest your hand over the edge, palm up.   Gently grip a hammer or a soup ladle.   Without moving your elbow, slowly turn your palm and hand upward to a "thumbs-up" position.   Hold this position for __________ seconds. Slowly return to the starting position.  Repeat __________ times. Complete this exercise __________ times per day.  STRENGTH - Grip  Grasp a tennis ball, a dense sponge, or a large, rolled sock in your hand.   Squeeze as hard as you can, without increasing any pain.   Hold this position for __________ seconds. Release your grip slowly.  Repeat __________ times. Complete this exercise __________ times per day.  STRENGTH - Elbow Extensors, Isometric  Stand or sit upright, on a firm surface. Place your right / left arm so that your palm faces your stomach, and it is at the height of your waist.   Place your opposite hand on the underside of your forearm. Gently push up as your right / left arm resists. Push as hard as you can with both arms, without causing any pain or movement at your right / left elbow. Hold this stationary position for __________ seconds.  Gradually release the tension in both arms. Allow your muscles to relax completely before repeating. Document  Released: 01/26/2005 Document Revised: 01/15/2011 Document Reviewed: 05/10/2008 [facility] Patient Information 2012 [place], [place].

## 2011-07-15 NOTE — Progress Notes (Signed)
Catherine Hill is a 26 y.o. female who presents for a complete physical.  She has the following concerns:  4 years ago she injured her R elbow (fell and broke it "partially", casted)--unable to lift weights due to pain R elbow.  Had pain lifting something in the store, randomly gets acute pain with lifting things.  Symptoms x 6 months.  Trouble lifting even just 5 pounds at the gym--okay initially, but pain with doing repetitions.  Her cholesterol was borderline last year.  Has made some dietary changes and is exercising more.  Due to be re-checked.  Not fasting today, but prefers to get done while here today.  Health Maintenance: Immunization History  Administered Date(s) Administered  . DTaP 09/15/1999  . HPV Quadrivalent 09/17/2005, 07/10/2008, 06/27/2009  . Influenza Split 12/11/2010  . Tdap 06/27/2009   Last Pap smear: 2 years ago--has appt Monday with GYN Last mammogram: 2007 Last colonoscopy: never Last DEXA: never Dentist: every 6 months Ophtho: yearly Exercise: 4x/week at gym, 60 minutes of cardio and weights.  Past Medical History  Diagnosis Date  . Dysmenorrhea   . Fibrocystic breast disease   . Allergy     seasonal  . Ovarian cyst 02/2011  . Mononucleosis 02/2011    Past Surgical History  Procedure Date  . Wisdom tooth extraction   . Laparoscopy 03/07/2011    Procedure: LAPAROSCOPY OPERATIVE;  Surgeon: Robley Fries, MD;  Location: [facility];  Service: Gynecology;  Laterality: Left;  Diagnostic laparoscopy and drainage of left ovarian cyst. Repair of cervical laceration.    History   Social History  . Marital Status: Single    Spouse Name: N/A    Number of Children: 0  . Years of Education: N/A   Occupational History  . 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 Smoking status: Never Smoker   . Smokeless tobacco: Never Used  . Alcohol Use: Yes     socially 3-4 drinks on weekends  . Drug Use: No  . Sexually Active: No    Other Topics Concern  . Not on file   Social History Narrative   Lives alone.  Family lives in town    Family History  Problem Relation Age of Onset  . Thyroid disease Mother   . Hyperlipidemia Mother   . Hyperlipidemia Father   . Vision loss Father     blind in L eye (?CVA?)  . Diabetes Maternal Uncle   . Cancer Paternal Grandfather     bladder cancer  . Hyperlipidemia Sister     Current outpatient prescriptions:ibuprofen (ADVIL,MOTRIN) 200 MG tablet, Take 600 mg by mouth every 8 (eight) hours as needed. For pain., Disp: , Rfl: ;  levonorgestrel-ethinyl estradiol (AVIANE,ALESSE,LESSINA) 0.1-20 MG-MCG tablet, Take 1 tablet by mouth daily., Disp: 28 tablet, Rfl: 2;  Multiple Vitamins-Minerals (ONE-A-DAY VITACRAVES IMMUNITY) CHEW, Chew 2 each by mouth daily., Disp: , Rfl:   No Known Allergies  ROS:  The patient denies anorexia, fever, weight changes, headaches,  vision changes, decreased hearing, ear pain, sore throat, breast concerns, chest pain, palpitations, dizziness, syncope, dyspnea on exertion, cough, swelling, nausea, vomiting, diarrhea, constipation, abdominal pain, melena, hematochezia, indigestion/heartburn, hematuria, incontinence, dysuria, irregular menstrual cycles, vaginal discharge, odor or itch, genital lesions, numbness, tingling, weakness, tremor, suspicious skin lesions (recently saw derm and had mole removed from back), depression, anxiety, abnormal bleeding/bruising, or enlarged lymph nodes. +R elbow pain per HPI  PHYSICAL EXAM: BP 104/62  Pulse 64  Ht  5' 4.25" (1.632 m)  Wt 180 lb (81.647 kg)  BMI 30.66 kg/m2  LMP 07/06/2011  General Appearance:    Alert, cooperative, no distress, appears stated age  Head:    Normocephalic, without obvious abnormality, atraumatic  Eyes:    PERRL, conjunctiva/corneas clear, EOM's intact, fundi    benign  Ears:    Normal TM's and external ear canals  Nose:   Nares normal, mucosa normal, no drainage or sinus   tenderness   Throat:   Lips, mucosa, and tongue normal; teeth and gums normal  Neck:   Supple, no lymphadenopathy;  thyroid:  no   enlargement/tenderness/nodules; no carotid   bruit or JVD  Back:    Spine nontender, no curvature, ROM normal, no CVA     tenderness  Lungs:     Clear to auscultation bilaterally without wheezes, rales or     ronchi; respirations unlabored  Chest Wall:    No tenderness or deformity   Heart:    Regular rate and rhythm, S1 and S2 normal, no murmur, rub   or gallop  Breast Exam:    Deferred to GYN  Abdomen:     Soft, non-tender, nondistended, normoactive bowel sounds,    no masses, no hepatosplenomegaly  Genitalia:    Deferred to GYN     Extremities:   No clubbing, cyanosis or edema. Mildly tender at R lateral epicondyle.  Pain with pronation against resistance.  Normal strength, sensation  Pulses:   2+ and symmetric all extremities  Skin:   Skin color, texture, turgor normal, no rashes or lesions  Lymph nodes:   Cervical, supraclavicular, and axillary nodes normal  Neurologic:   CNII-XII intact, normal strength, sensation and gait; reflexes 2+ and symmetric throughout          Psych:   Normal mood, affect, hygiene and grooming.     ASSESSMENT/PLAN: 1. Routine general medical examination at a health care facility  POCT Urinalysis Dipstick, Visual acuity screening  2. Pure hypercholesterolemia  Lipid panel  3. Lateral epicondylitis (tennis elbow), right      Discussed monthly self breast exams and yearly mammograms after the age of 37; at least 30 minutes of aerobic activity at least 5 days/week; proper sunscreen use reviewed; healthy diet, including goals of calcium and vitamin D intake and alcohol recommendations (less than or equal to 1 drink/day) reviewed; regular seatbelt use; changing batteries in smoke detectors.  Immunization recommendations discussed--UTD  Lateral epicondylitis: Aleve (up to 2 BID for up to 2 weeks) and tennis elbow strap.  Given handout with  exercises.  Follow up if worsening/ongoing pain.

## 2011-07-16 ENCOUNTER — Encounter: Payer: Self-pay | Admitting: Family Medicine

## 2013-06-30 IMAGING — US US ART/VEN ABD/PELV/SCROTUM DOPPLER LTD
1 series · 13 of 25 positions shown · non-contrast
Comparison: CT abdomen pelvis dated 03/05/2011

CLINICAL DATA: Known left ovarian cyst, pain, evaluate for torsion

TRANSABDOMINAL AND TRANSVAGINAL ULTRASOUND OF PELVIS
DOPPLER ULTRASOUND OF OVARIES
TECHNIQUE: Both transabdominal and transvaginal ultrasound
examinations of the pelvis were performed. Transabdominal technique
was performed for global imaging of the pelvis including uterus,
ovaries, adnexal regions, and pelvic cul-de-sac.
It was necessary to proceed with endovaginal exam following the
transabdominal exam to visualize the endometrium.
Color and duplex Doppler ultrasound was utilized to evaluate blood
flow to the ovaries.

[Series 1: us art/ven abd/pelv/scrotum doppler ltd · 0.30mm/px · 13 of 41 slices shown]
[im 1/41]
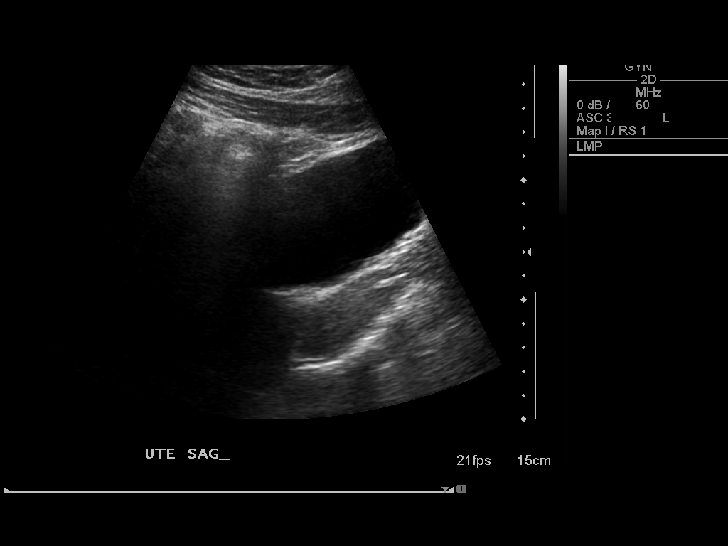
[im 4/41]
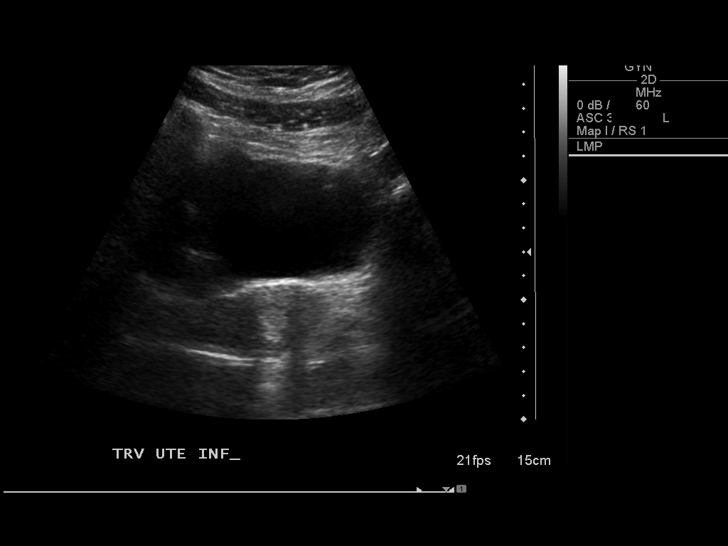
[im 7/41]
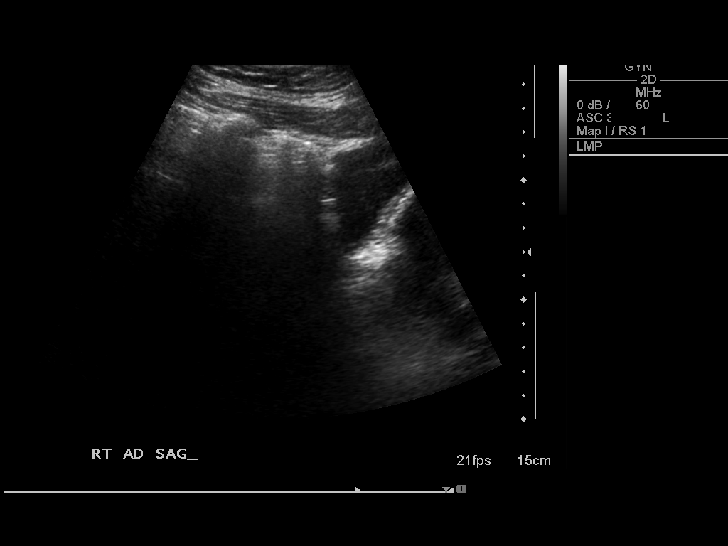
[im 11/41]
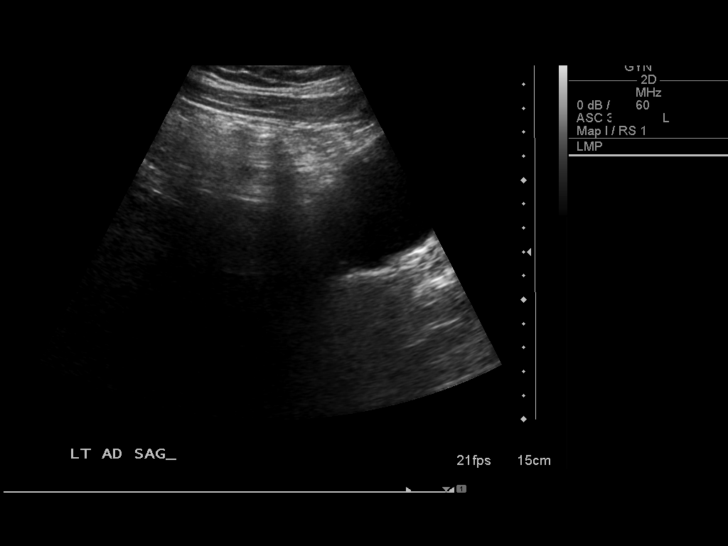
[im 14/41]
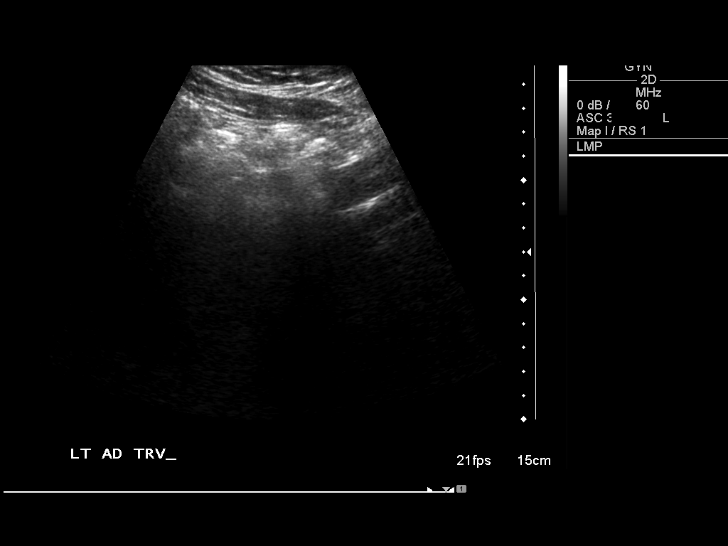
[im 17/41]
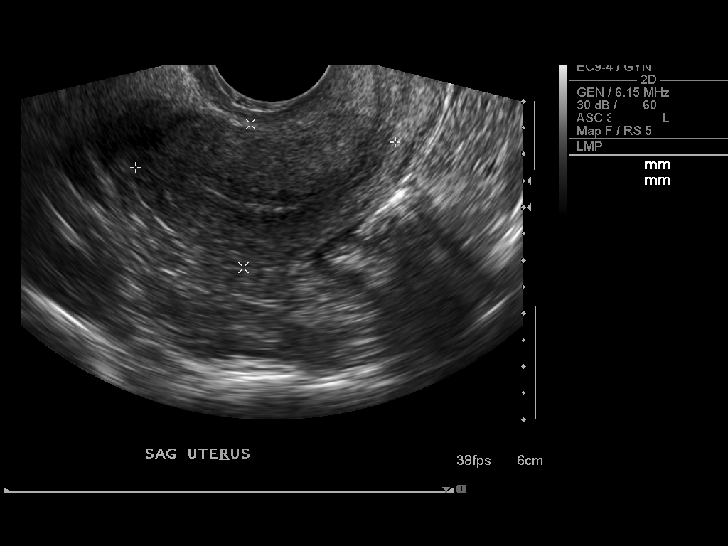
[im 21/41]
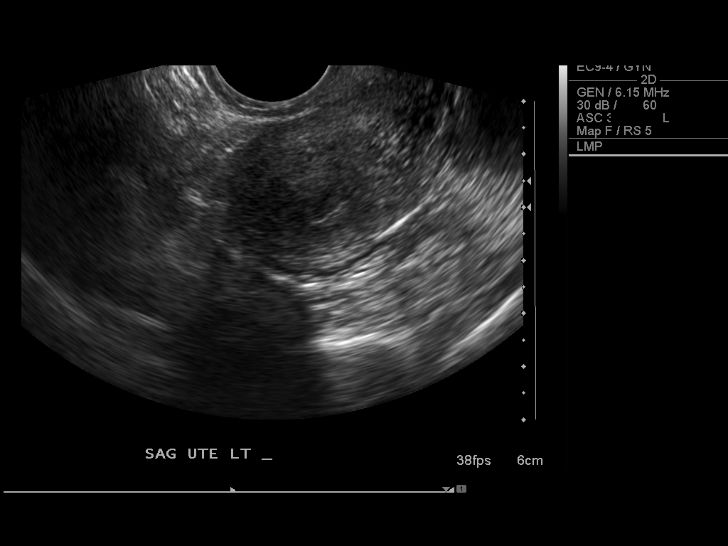
[im 24/41]
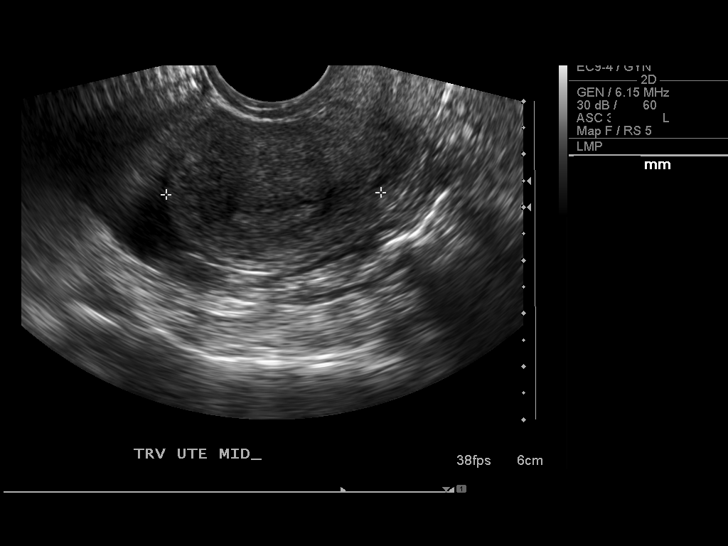
[im 27/41]
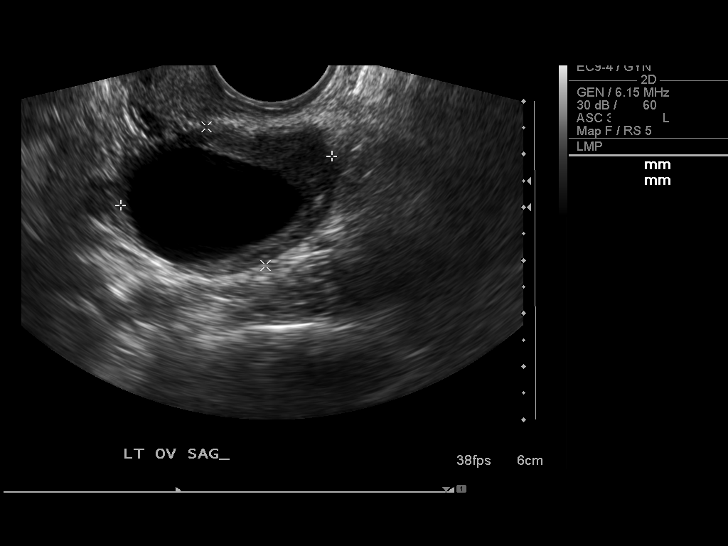
[im 31/41]
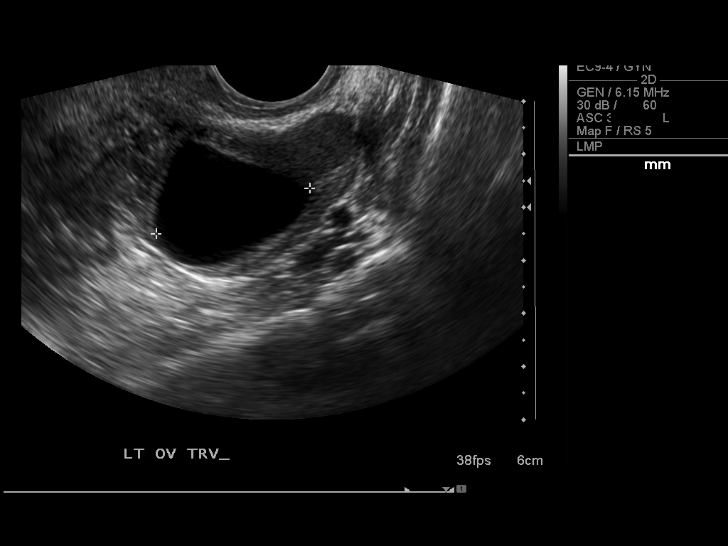
[im 34/41]
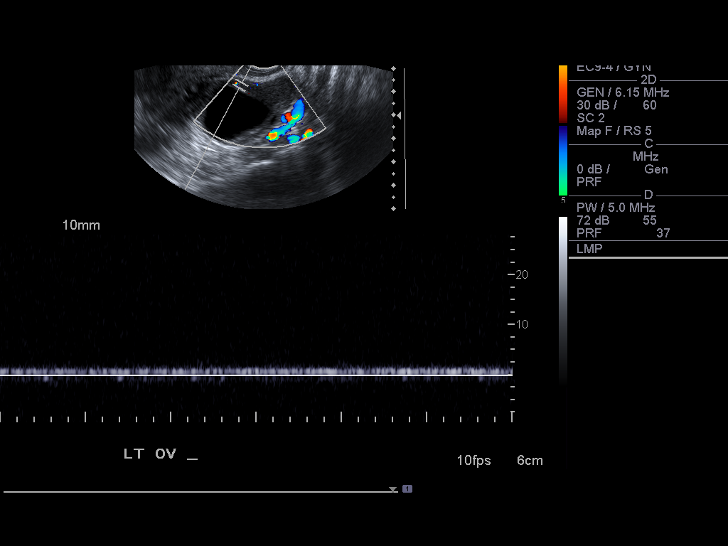
[im 37/41]
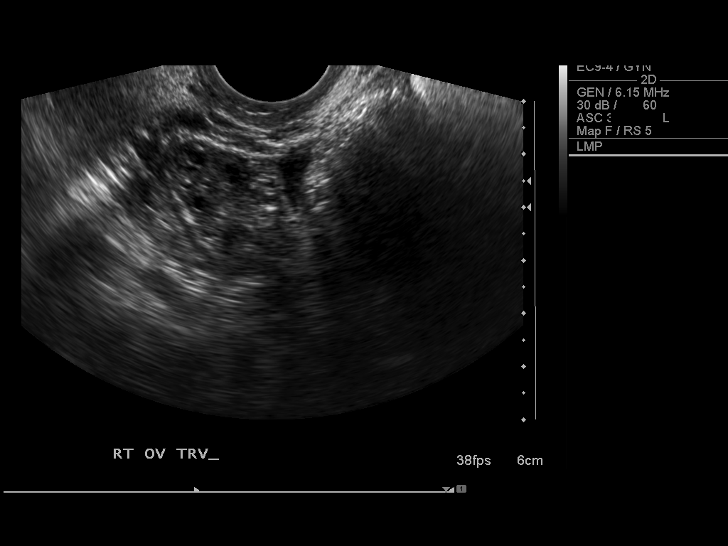
[im 41/41]
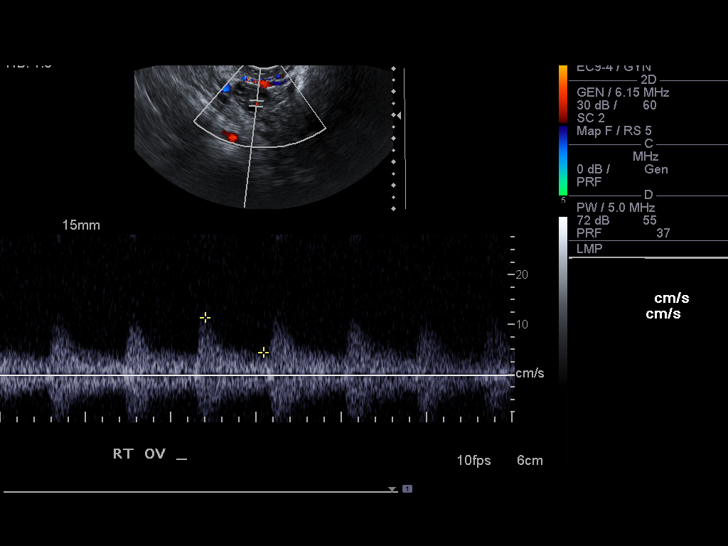

[13 of 25 positions shown; findings below may reference images not displayed]

FINDINGS: Uterus:  Normal in size and appearance, measuring 4.9 x 2.7 x
cm.

Endometrium:  Normal in thickness and appearance, measuring 4 mm.

Right ovary: Normal appearance/no adnexal mass, measuring 2.6 x
x 1.6 cm.

Left ovary:   Measures 4.1 x 2.8 x 4.5 cm and is notable for a
x 2.2 x 3.0 cm cyst.

Pulsed Doppler evaluation demonstrates normal low-resistance
arterial and venous waveforms in both ovaries.
IMPRESSION: 3.3 cm left ovarian cyst.

Normal sonographic appearance of the uterus and right ovary.

No sonographic evidence for ovarian torsion.

## 2013-08-23 ENCOUNTER — Encounter: Payer: Self-pay | Admitting: Family Medicine

## 2013-08-23 ENCOUNTER — Ambulatory Visit (INDEPENDENT_AMBULATORY_CARE_PROVIDER_SITE_OTHER): Payer: 59 | Admitting: Family Medicine

## 2013-08-23 VITALS — BP 110/70 | HR 76 | Ht 67.0 in | Wt 177.0 lb

## 2013-08-23 DIAGNOSIS — R5381 Other malaise: Secondary | ICD-10-CM

## 2013-08-23 DIAGNOSIS — R5383 Other fatigue: Secondary | ICD-10-CM

## 2013-08-23 DIAGNOSIS — E7800 Pure hypercholesterolemia, unspecified: Secondary | ICD-10-CM

## 2013-08-23 DIAGNOSIS — Z0000 Encounter for general adult medical examination without abnormal findings: Secondary | ICD-10-CM

## 2013-08-23 DIAGNOSIS — F411 Generalized anxiety disorder: Secondary | ICD-10-CM

## 2013-08-23 DIAGNOSIS — N926 Irregular menstruation, unspecified: Secondary | ICD-10-CM

## 2013-08-23 DIAGNOSIS — G4700 Insomnia, unspecified: Secondary | ICD-10-CM

## 2013-08-23 LAB — COMPREHENSIVE METABOLIC PANEL
ALT: 12 U/L (ref 0–35)
AST: 17 U/L (ref 0–37)
Albumin: 4.3 g/dL (ref 3.5–5.2)
Alkaline Phosphatase: 57 U/L (ref 39–117)
BUN: 14 mg/dL (ref 6–23)
CALCIUM: 9.2 mg/dL (ref 8.4–10.5)
CHLORIDE: 105 meq/L (ref 96–112)
CO2: 23 meq/L (ref 19–32)
CREATININE: 0.71 mg/dL (ref 0.50–1.10)
GLUCOSE: 74 mg/dL (ref 70–99)
Potassium: 4 mEq/L (ref 3.5–5.3)
Sodium: 137 mEq/L (ref 135–145)
Total Bilirubin: 0.5 mg/dL (ref 0.2–1.2)
Total Protein: 7.1 g/dL (ref 6.0–8.3)

## 2013-08-23 LAB — CBC WITH DIFFERENTIAL/PLATELET
BASOS ABS: 0.1 10*3/uL (ref 0.0–0.1)
BASOS PCT: 1 % (ref 0–1)
Eosinophils Absolute: 0.1 10*3/uL (ref 0.0–0.7)
Eosinophils Relative: 1 % (ref 0–5)
HCT: 36.6 % (ref 36.0–46.0)
HEMOGLOBIN: 12.6 g/dL (ref 12.0–15.0)
Lymphocytes Relative: 30 % (ref 12–46)
Lymphs Abs: 1.7 10*3/uL (ref 0.7–4.0)
MCH: 30.5 pg (ref 26.0–34.0)
MCHC: 34.4 g/dL (ref 30.0–36.0)
MCV: 88.6 fL (ref 78.0–100.0)
MONOS PCT: 6 % (ref 3–12)
Monocytes Absolute: 0.3 10*3/uL (ref 0.1–1.0)
NEUTROS ABS: 3.5 10*3/uL (ref 1.7–7.7)
NEUTROS PCT: 62 % (ref 43–77)
Platelets: 274 10*3/uL (ref 150–400)
RBC: 4.13 MIL/uL (ref 3.87–5.11)
RDW: 12.7 % (ref 11.5–15.5)
WBC: 5.6 10*3/uL (ref 4.0–10.5)

## 2013-08-23 LAB — POCT URINALYSIS DIPSTICK
BILIRUBIN UA: NEGATIVE
Blood, UA: NEGATIVE
GLUCOSE UA: NEGATIVE
Ketones, UA: NEGATIVE
LEUKOCYTES UA: NEGATIVE
NITRITE UA: NEGATIVE
Protein, UA: NEGATIVE
Spec Grav, UA: <=1.005
UROBILINOGEN UA: NEGATIVE
pH, UA: 5

## 2013-08-23 LAB — LIPID PANEL
CHOLESTEROL: 154 mg/dL (ref 0–200)
HDL: 49 mg/dL (ref >39)
LDL Cholesterol: 84 mg/dL (ref 0–99)
TRIGLYCERIDES: 107 mg/dL (ref <150)
Total CHOL/HDL Ratio: 3.1 Ratio
VLDL: 21 mg/dL (ref 0–40)

## 2013-08-23 LAB — TSH: TSH: 1.312 u[IU]/mL (ref 0.350–4.500)

## 2013-08-23 MED ORDER — ZOLPIDEM TARTRATE 10 MG PO TABS: 5.0000 mg | ORAL_TABLET | Freq: Every evening | ORAL | Status: DC | PRN

## 2013-08-23 NOTE — Patient Instructions (Addendum)
HEALTH MAINTENANCE RECOMMENDATIONS:  It is recommended that you get at least 30 minutes of aerobic exercise at least 5 days/week (for weight loss, you may need as much as 60-90 minutes). This can be any activity that gets your heart rate up. This can be divided in 10-15 minute intervals if needed, but try and build up your endurance at least once a week.  Weight bearing exercise is also recommended twice weekly.  Eat a healthy diet with lots of vegetables, fruits and fiber.  "Colorful" foods have a lot of vitamins (ie green vegetables, tomatoes, red peppers, etc).  Limit sweet tea, regular sodas and alcoholic beverages, all of which has a lot of calories and sugar.  Up to 1 alcoholic drink daily may be beneficial for women (unless trying to lose weight, watch sugars).  Drink a lot of water.  Calcium recommendations are 1200-1500 mg daily (1500 mg for postmenopausal women or women without ovaries), and vitamin D 1000 IU daily.  This should be obtained from diet and/or supplements (vitamins), and calcium should not be taken all at once, but in divided doses.  Monthly self breast exams and yearly mammograms for women over the age of 34 is recommended.  Sunscreen of at least SPF 30 should be used on all sun-exposed parts of the skin when outside between the hours of 10 am and 4 pm (not just when at beach or pool, but even with exercise, golf, tennis, and yard work!)  Use a sunscreen that says "broad spectrum" so it covers both UVA and UVB rays, and make sure to reapply every 1-2 hours.  Remember to change the batteries in your smoke detectors when changing your clock times in the spring and fall.  Use your seat belt every time you are in a car, and please drive safely and not be distracted with cell phones and texting while driving.  I recommend considering counseling to help with your anxiety and stress.  Berniece Andreas at [facility], on [address], is wonderful.  If you are unable to get adequate  sleep with melatonin and/or the PM medication, then try the ambien.  Start at 1/2 tablet, and increase to the full tablet if needed.  Do NOT take it in the middle of the night--you need a full 8 hours.  Stop taking it if there is any unusual behavior occurs.  Likely your irregular periods are related to stress.  Discuss next month with your GYN.  We will check thyroid to rule out other causes.  Insomnia Insomnia is frequent trouble falling and/or staying asleep. Insomnia can be a long term problem or a short term problem. Both are common. Insomnia can be a short term problem when the wakefulness is related to a certain stress or worry. Long term insomnia is often related to ongoing stress during waking hours and/or poor sleeping habits. Overtime, sleep deprivation itself can make the problem worse. Every little thing feels more severe because you are overtired and your ability to cope is decreased. CAUSES   Stress, anxiety, and depression.  Poor sleeping habits.  Distractions such as TV in the bedroom.  Naps close to bedtime.  Engaging in emotionally charged conversations before bed.  Technical reading before sleep.  Alcohol and other sedatives. They may make the problem worse. They can hurt normal sleep patterns and normal dream activity.  Stimulants such as caffeine for several hours prior to bedtime.  Pain syndromes and shortness of breath can cause insomnia.  Exercise late at night.  Changing time zones may cause sleeping problems (jet lag). It is sometimes helpful to have someone observe your sleeping patterns. They should look for periods of not breathing during the night (sleep apnea). They should also look to see how long those periods last. If you live alone or observers are uncertain, you can also be observed at a sleep clinic where your sleep patterns will be professionally monitored. Sleep apnea requires a checkup and treatment. Give your caregivers your medical history. Give  your caregivers observations your family has made about your sleep.  SYMPTOMS   Not feeling rested in the morning.  Anxiety and restlessness at bedtime.  Difficulty falling and staying asleep. TREATMENT   Your caregiver may prescribe treatment for an underlying medical disorders. Your caregiver can give advice or help if you are using alcohol or other drugs for self-medication. Treatment of underlying problems will usually eliminate insomnia problems.  Medications can be prescribed for short time use. They are generally not recommended for lengthy use.  Over-the-counter sleep medicines are not recommended for lengthy use. They can be habit forming.  You can promote easier sleeping by making lifestyle changes such as:  Using relaxation techniques that help with breathing and reduce muscle tension.  Exercising earlier in the day.  Changing your diet and the time of your last meal. No night time snacks.  Establish a regular time to go to bed.  Counseling can help with stressful problems and worry.  Soothing music and white noise may be helpful if there are background noises you cannot remove.  Stop tedious detailed work at least one hour before bedtime. HOME CARE INSTRUCTIONS   Keep a diary. Inform your caregiver about your progress. This includes any medication side effects. See your caregiver regularly. Take note of:  Times when you are asleep.  Times when you are awake during the night.  The quality of your sleep.  How you feel the next day. This information will help your caregiver care for you.  Get out of bed if you are still awake after 15 minutes. Read or do some quiet activity. Keep the lights down. Wait until you feel sleepy and go back to bed.  Keep regular sleeping and waking hours. Avoid naps.  Exercise regularly.  Avoid distractions at bedtime. Distractions include watching television or engaging in any intense or detailed activity like attempting to  balance the household checkbook.  Develop a bedtime ritual. Keep a familiar routine of bathing, brushing your teeth, climbing into bed at the same time each night, listening to soothing music. Routines increase the success of falling to sleep faster.  Use relaxation techniques. This can be using breathing and muscle tension release routines. It can also include visualizing peaceful scenes. You can also help control troubling or intruding thoughts by keeping your mind occupied with boring or repetitive thoughts like the old concept of counting sheep. You can make it more creative like imagining planting one beautiful flower after another in your backyard garden.  During your day, work to eliminate stress. When this is not possible use some of the previous suggestions to help reduce the anxiety that accompanies stressful situations. MAKE SURE YOU:   Understand these instructions.  Will watch your condition.  Will get help right away if you are not doing well or get worse. Document Released: 01/24/2000 Document Revised: 04/20/2011 Document Reviewed: 02/23/2007 [facility] Patient Information 2015 [employer], [place]. This information is not intended to replace advice given to you by your health care provider.  Make sure you discuss any questions you have with your health care provider.  

## 2013-08-23 NOTE — Progress Notes (Signed)
Chief Complaint  Patient presents with  . Annual Exam    nonfasting annual exam with pap-sees Dr.Modie and is UTD. Would like to discuss some emotional issues, has been having random crying. Also having some irregular periods.    Catherine Hill is a 28 y.o. female who presents for a complete physical.  She has the following concerns:  She has been having emotional problems trouble sleeping, and recently an irregular menstrual cycle.  She got a dog 2 months ago, and her parents feel like that is when they started noticing her getting more emotional/stressed.  She only had the dog for a week, realizing it didn't fit well with her lifestyle (would have been in a crate 12 hrs/day, small yard, large dog-- black lab).  It was very difficult for her when she made that decision, but feels that he went to a better home (young family with kids and another dog). She only had the dog for a week. She doesn't feel depressed about that. She has other stressors that she feel are more likely contributing to her stress/moods--Cracks in the foundation of her townhouse.  Builder went bankrupt, many others having problems.  One neighbor got estimate of $40K to repair  Randomly starts crying recently (not related to cycles, no h/o PMDD)--typically she craves sweets around cycles. She is admittedly a worrier--will think about things a lot, gets some tightness in her chest when stressed, but not a full blown panic attack  LMP 6/22.  She is on OCP's (prescribed by GYN) and usually cycles are regular and very light. She had some spotting on 7/6, and bled on and off for a few days, stopped, but started again yesterday.  Not due to have her period until 7/20. Denies any missed pills.  This is the first month that she has had any abnormal bleeding; periods are usually very light, and regular. She is not in a sexual relationship.  She has been having trouble sleeping for about a month.  She usually falls asleep okay, but wakes  up around 2:30-3 and has trouble getting back to sleep. She will get up, look up info about what is stressing her out (ie how to repair cracks in foundation). Sometimes she will take a second Tylenol PM, but felt too groggy when she did that--stopped taking second one.  "clean eating" at home, not quite as careful when eating out, but still tries to eat healthy.  Immunization History  Administered Date(s) Administered  . DTaP 09/15/1999  . HPV Quadrivalent 09/17/2005, 07/10/2008, 06/27/2009  . Influenza Split 12/11/2010  . Tdap 06/27/2009  gets yearly flu shots at work Last Pap smear: UTD with GYN (09/2012) Last mammogram: 2007  Last colonoscopy: never  Last DEXA: never  Dentist: every 6 months  Ophtho: yearly  Exercise: 4x/week at gym, 60 minutes of cardio and weights. H/o borderline cholesterol in the past, improved with dietary changes (rechecked 07/2011) Lab Results  Component Value Date   CHOL 152 07/15/2011   CHOL 191 07/11/2010   Lab Results  Component Value Date   HDL 37* 07/15/2011   HDL 46 02/14/1094   Lab Results  Component Value Date   LDLCALC 91 07/15/2011   LDLCALC 132* 07/11/2010   Lab Results  Component Value Date   TRIG 120 07/15/2011   TRIG 66 07/11/2010   Lab Results  Component Value Date   CHOLHDL 4.1 07/15/2011   CHOLHDL 4.2 07/11/2010   No results found for this basename: LDLDIRECT  Past Medical History  Diagnosis Date  . Dysmenorrhea   . Fibrocystic breast disease   . Allergy     seasonal  . Ovarian cyst 02/2011  . Mononucleosis 02/2011    Past Surgical History  Procedure Laterality Date  . Wisdom tooth extraction    . Laparoscopy  03/07/2011    Procedure: LAPAROSCOPY OPERATIVE;  Surgeon: Robley Fries, MD;  Location: [facility];  Service: Gynecology;  Laterality: Left;  Diagnostic laparoscopy and drainage of left ovarian cyst. Repair of cervical laceration.    History   Social History  . Marital Status: Single    Spouse Name: N/A    Number of  Children: 0  . 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 Smoking status: Never Smoker   . Smokeless tobacco: Never Used  . Alcohol Use: Yes     Comment: socially 3-4 drinks on weekends  . Drug Use: No  . Sexual Activity: No   Other Topics Concern  . Not on file   Social History Narrative   Lives alone.  Family lives in town    Family History  Problem Relation Age of Onset  . Thyroid disease Mother   . Hyperlipidemia Mother   . Hyperlipidemia Father   . Vision loss Father     blind in L eye (?CVA?)  . Diabetes Maternal Uncle   . Cancer Paternal Grandfather     bladder cancer  . Hyperlipidemia Sister   . Cancer Maternal Aunt     lung (smoker)   Outpatient Encounter Prescriptions as of 08/23/2013  Medication Sig Note  . diphenhydramine-acetaminophen (TYLENOL PM) 25-500 MG TABS Take 1 tablet by mouth at bedtime as needed. 08/23/2013: Using every night for the last week  . levonorgestrel-ethinyl estradiol (AVIANE,ALESSE,LESSINA) 0.1-20 MG-MCG tablet Take 1 tablet by mouth daily.   . Passion Flower POWD 1 capsule by Does not apply route daily. 08/23/2013: Taking for 1-2 weeks, for anxiety  . [DISCONTINUED] ibuprofen (ADVIL,MOTRIN) 200 MG tablet Take 600 mg by mouth every 8 (eight) hours as needed. For pain.   . [DISCONTINUED] Multiple Vitamins-Minerals (ONE-A-DAY VITACRAVES IMMUNITY) CHEW Chew 2 each by mouth daily.     No Known Allergies  ROS: The patient denies anorexia, fever, weight changes, headaches, vision changes, decreased hearing, ear pain, sore throat, breast concerns, chest pain, palpitations, dizziness, syncope, dyspnea on exertion, cough, swelling, nausea, vomiting, diarrhea, constipation, abdominal pain, melena, hematochezia, indigestion/heartburn, hematuria, incontinence, dysuria, vaginal discharge, odor or itch, genital lesions, numbness, tingling, weakness, tremor, suspicious skin  lesions, depression, abnormal bleeding/bruising, or enlarged lymph nodes. + irregular menstrual cycles just this past month +insomnia, stress/anxiety Denies depression  PHYSICAL EXAM:  BP 110/70  Pulse 76  Ht 5\' 7"  (1.702 m)  Wt 177 lb (80.287 kg)  BMI 27.72 kg/m2  LMP 08/14/2013  General Appearance:  Alert, cooperative, no distress, appears stated age   Head:  Normocephalic, without obvious abnormality, atraumatic   Eyes:  PERRL, conjunctiva/corneas clear, EOM's intact, fundi  benign   Ears:  Normal TM's and external ear canals   Nose:  Nares normal, mucosa normal, no drainage or sinus tenderness   Throat:  Lips, mucosa, and tongue normal; teeth and gums normal   Neck:  Supple, no lymphadenopathy; thyroid: no enlargement/tenderness/nodules; no carotid  bruit or JVD   Back:  Spine nontender, no curvature, ROM normal, no CVA tenderness   Lungs:  Clear to auscultation  bilaterally without wheezes, rales or ronchi; respirations unlabored   Chest Wall:  No tenderness or deformity   Heart:  Regular rate and rhythm, S1 and S2 normal, no murmur, rub  or gallop   Breast Exam:  Deferred to GYN   Abdomen:  Soft, non-tender, nondistended, normoactive bowel sounds,  no masses, no hepatosplenomegaly   Genitalia:  Deferred to GYN      Extremities:  No clubbing, cyanosis or edema.   Pulses:  2+ and symmetric all extremities   Skin:  Skin color, texture, turgor normal, no rashes or lesions   Lymph nodes:  Cervical, supraclavicular, and axillary nodes normal   Neurologic:  CNII-XII intact, normal strength, sensation and gait; reflexes 2+ and symmetric throughout          Psych: Normal mood, affect, hygiene and grooming. Does not appear anxious, full range of affect. Normal speech, eye contact.   ASSESSMENT/PLAN:  Routine general medical examination at a health care facility - Plan: POCT Urinalysis Dipstick, Visual acuity screening, Lipid panel, Comprehensive metabolic panel, Vit D  25  hydroxy (rtn osteoporosis monitoring), CBC with Differential, TSH  Anxiety state, unspecified - some increased stress related to crack in foundation of her townhouse. Stress reduction, consider counseling.  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  Poor sleep contributing to poor coping, more emotional.  Trial of ambien, short-term, prn, to catch up on sleep - Plan: zolpidem (AMBIEN) 10 MG tablet  Other malaise and fatigue - Plan: Comprehensive metabolic panel, Vit D  25 hydroxy (rtn osteoporosis monitoring), CBC with Differential, TSH  Pure hypercholesterolemia - improved with dietary changes in past. continue lowfat, low cholesterol diet (nonfasting today) - Plan: Lipid panel  Irregular menses - no missed pills, possibly related to stress.  discuss with GYN if ongoing abnormal cycles. r/o thyroid disease - Plan: TSH   Discussed monthly self breast exams and yearly mammograms after the age of 28; at least 30 minutes of aerobic activity at least 5 days/week; proper sunscreen use reviewed; healthy diet, including goals of calcium and vitamin D intake and alcohol recommendations (less than or equal to 1 drink/day) reviewed; regular seatbelt use; changing batteries in smoke detectors. Immunization recommendations discussed--continue yearly flu shots.  Insomnia--recent onset, related to stress/anxiety. Discussed anxiety, stress reduction, coping skills, and treatment of insomnia  ambien 10mg , 1/2-1 as needed.  If still not staying asleep the whole night, pt to contact us and let us know if she tolerated the full tablet, or only the 1/2, and can then change to the CR.   She is nonfasting today (had a kale smoothie about 3 hours earlier)  At least 20 minutes spent counseling

## 2013-08-24 LAB — VITAMIN D 25 HYDROXY (VIT D DEFICIENCY, FRACTURES): VIT D 25 HYDROXY: 40 ng/mL (ref 30–89)

## 2013-08-28 ENCOUNTER — Encounter: Payer: Self-pay | Admitting: Family Medicine

## 2013-09-07 ENCOUNTER — Ambulatory Visit (INDEPENDENT_AMBULATORY_CARE_PROVIDER_SITE_OTHER): Payer: 59 | Admitting: Family Medicine

## 2013-09-07 ENCOUNTER — Encounter: Payer: Self-pay | Admitting: Family Medicine

## 2013-09-07 VITALS — BP 100/64 | HR 72 | Temp 98.6°F | Ht 67.0 in | Wt 176.0 lb

## 2013-09-07 DIAGNOSIS — J029 Acute pharyngitis, unspecified: Secondary | ICD-10-CM

## 2013-09-07 DIAGNOSIS — J069 Acute upper respiratory infection, unspecified: Secondary | ICD-10-CM

## 2013-09-07 LAB — POCT RAPID STREP A (OFFICE): RAPID STREP A SCREEN: NEGATIVE

## 2013-09-07 NOTE — Progress Notes (Signed)
Chief Complaint  Patient presents with  . Sore Throat    started yesterday with sore throat, has slight nasal congestion. Going to [place] tomorrow and will be around many nieces and nephews. Would like to be checked for strep today.    Yesterday morning she noticed sore throat, got worse throughout the day.  She tried taking allergy medications, as well as Aleve cold and sinus, and neither have helped very much.  She started sniffling this morning.  Denies sinus pressure.  Mucus is clear.  Denies cough.  She has had slight chills, but no known fevers.  No known sick contacts.  Past Medical History  Diagnosis Date  . Dysmenorrhea   . Fibrocystic breast disease   . Allergy     seasonal  . Ovarian cyst 02/2011  . Mononucleosis 02/2011   Past Surgical History  Procedure Laterality Date  . Wisdom tooth extraction    . Laparoscopy  03/07/2011    Procedure: LAPAROSCOPY OPERATIVE;  Surgeon: Robley Fries, MD;  Location: [facility];  Service: Gynecology;  Laterality: Left;  Diagnostic laparoscopy and drainage of left ovarian cyst. Repair of cervical laceration.   History   Social History  . Marital Status: Single    Spouse Name: N/A    Number of Children: 0  . 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 Smoking status: Never Smoker   . Smokeless tobacco: Never Used  . Alcohol Use: Yes     Comment: socially 3-4 drinks on weekends  . Drug Use: No  . Sexual Activity: No   Other Topics Concern  . Not on file   Social History Narrative   Lives alone.  Family lives in town   Outpatient Encounter Prescriptions as of 09/07/2013  Medication Sig Note  . diphenhydramine-acetaminophen (TYLENOL PM) 25-500 MG TABS Take 1 tablet by mouth at bedtime as needed. 09/07/2013: Uses occasionally for insomnia, if needed; hasn't used it recently  . levonorgestrel-ethinyl estradiol (AVIANE,ALESSE,LESSINA) 0.1-20 MG-MCG tablet  Take 1 tablet by mouth daily.   Marland Kitchen zolpidem (AMBIEN) 10 MG tablet Take 0.5-1 tablets (5-10 mg total) by mouth at bedtime as needed for sleep. 09/07/2013: Uses it sporadically, just twice in the last 2 weeks (1/3 tablet was effective)  . Passion Flower POWD 1 capsule by Does not apply route daily. 08/23/2013: Taking for 1-2 weeks, for anxiety   No Known Allergies  ROS:  Denies fevers, headaches, dizziness, ear pain, sinus pressure.  +sniffling, sore throat.  Denies nausea, vomiting, diarrhea, bleeding, bruising, rashes.  PHYSICAL EXAM: BP 100/64  Pulse 72  Temp(Src) 98.6 F (37 C) (Tympanic)  Ht 5\' 7"  (1.702 m)  Wt 176 lb (79.833 kg)  BMI 27.56 kg/m2  LMP 08/28/2013  Well developed, pleasant female, with frequent sniffling, in no distress HEENT:  PERRL, EOMI, conjunctiva clear.  TM's and EAC's normal. Nasal mucosa is mild-moderately edematous, pale, with clear mucus.  Sinuses are nontender. OP-tonsils are normal, no erythema or exudate.  Moist mucus membranes Neck: no lymphadenopathy or mass Heart; regular rate and rhythm, no murmur Lungs: clear bilaterally Skin: no rashes Neuro: alert and oriented.  Cranial nerves intact. Normal gait Psych: normal mood, affect  Rapid strep: negative  ASSESSMENT/PLAN:  Sore throat - Plan: Rapid Strep A  Acute upper respiratory infections of unspecified site  URI vs allergies, with sore throat related to PND. Continue supportive measures with decongestants and/or anti-histamines. Use  aleve and/or tylenol as needed for pain.  Consider salt water gargles and/or chloraseptic spray as needed. If due to a virus, expect that you might get worse before you get better, including progression to cough and chest congestion.  Call if symptoms last >7-10 days with discolored mucus, sinus pain or other concerns.  Return for recheck if new symptoms, shortness of breath, rash or other problems.

## 2013-09-07 NOTE — Patient Instructions (Signed)
Your strep test was negative.  Continue supportive measures with decongestants and/or anti-histamines. Use aleve and/or tylenol as needed for pain.  Consider salt water gargles and/or chloraseptic spray as needed. If due to a virus, expect that you might get worse before you get better, including progression to cough and chest congestion.  Call if symptoms last >7-10 days with discolored mucus, sinus pain or other concerns.  Return for recheck if new symptoms, shortness of breath, rash or other problems.

## 2013-09-14 ENCOUNTER — Telehealth: Payer: Self-pay | Admitting: Internal Medicine

## 2013-09-14 MED ORDER — AMOXICILLIN 875 MG PO TABS: 875.0000 mg | ORAL_TABLET | Freq: Two times a day (BID) | ORAL | Status: DC

## 2013-09-14 MED ORDER — FLUCONAZOLE 150 MG PO TABS: 150.0000 mg | ORAL_TABLET | Freq: Once | ORAL | Status: DC

## 2013-09-14 NOTE — Telephone Encounter (Signed)
Let her know that it did call in an antibiotic as well as something for yeast infection

## 2013-09-14 NOTE — Telephone Encounter (Signed)
Pt states she came in to see Dr.knapp for being sick and now she states it has turned into a sinus infection. She said Dr. Lynelle DoctorKnapp told her she would call in a medicine if she got worse. Can you send in something for a sinus infection. Pt also says she tends to get yeast infections when she takes antibotics. Send to [facility] [address]

## 2013-09-14 NOTE — Telephone Encounter (Signed)
LEFT MESSAGE WORD FOR WORD 

## 2013-09-14 NOTE — Telephone Encounter (Signed)
Amoxil called in as well as Diflucan.

## 2013-09-18 ENCOUNTER — Telehealth: Payer: Self-pay | Admitting: Family Medicine

## 2013-09-18 NOTE — Telephone Encounter (Signed)
Spoke with patient and she is not having any discolored mucus, clear drip. Never had any discolored mucus. No fevers. She is only using the ibuprofen and sudafed-no sinus rinses or mucinex.

## 2013-09-18 NOTE — Telephone Encounter (Signed)
Please call, she has been on antibiotics since Friday, no relief A lot of sinus pressure/teeth pain Using ibuprofen and sudafed at night

## 2013-09-18 NOTE — Telephone Encounter (Signed)
Patient advised.

## 2013-09-18 NOTE — Telephone Encounter (Signed)
So she is having sinus pressure, not symptoms of ongoing infection--the mucus in her sinuses isn't draining.  Try sinus rinses and Mucinex (plain or extra strength--doesn't need DM if not coughing, and shouldn't take D if taking sudafed separately).  Complete the amoxacillin course

## 2013-09-18 NOTE — Telephone Encounter (Signed)
Please call pt and see if having discolored mucus, fever. Is she using sinus rinses and Mucinex?

## 2015-01-17 ENCOUNTER — Encounter: Payer: Self-pay | Admitting: Family Medicine

## 2015-01-17 ENCOUNTER — Ambulatory Visit (INDEPENDENT_AMBULATORY_CARE_PROVIDER_SITE_OTHER): Payer: Commercial Managed Care - HMO | Admitting: Family Medicine

## 2015-01-17 VITALS — BP 120/76 | HR 68 | Ht 64.5 in | Wt 171.0 lb

## 2015-01-17 DIAGNOSIS — Z0000 Encounter for general adult medical examination without abnormal findings: Secondary | ICD-10-CM | POA: Diagnosis not present

## 2015-01-17 DIAGNOSIS — Z8639 Personal history of other endocrine, nutritional and metabolic disease: Secondary | ICD-10-CM

## 2015-01-17 LAB — POCT URINALYSIS DIPSTICK
Bilirubin, UA: NEGATIVE
CLARITY UA: NEGATIVE
Glucose, UA: NEGATIVE
KETONES UA: NEGATIVE
Leukocytes, UA: NEGATIVE
Nitrite, UA: NEGATIVE
PROTEIN UA: NEGATIVE
SPEC GRAV UA: 1.02
Urobilinogen, UA: NEGATIVE
pH, UA: 6.5

## 2015-01-17 NOTE — Patient Instructions (Signed)

## 2015-01-17 NOTE — Progress Notes (Signed)
Chief Complaint  Patient presents with  . Annual Exam    nonfasting annual exam, no pap sees Dr.Modi and UTD. No eye exam, she is having one next week at [facility].  UA showed trace blood, on period-no urinary symptoms. No concerns.     Catherine Hill is a 29 y.o. female who presents for a complete physical.  She has no concerns.  She is happy at her job, living with her boyfriend, and gets regular exercise. She recently ran her first 5K, and is considering running a half marathon in the [place] ([place]). She reports some lateral hip pain, usually after running.  Admits to not stretching at all.  She sees GYN yearly.  She is on OCP's for contraception, and her cycles are regular and light.     Immunization History  Administered Date(s) Administered  . DTaP 09/15/1999  . HPV Quadrivalent 09/17/2005, 07/10/2008, 06/27/2009  . Influenza Split 12/11/2010  . Influenza-Unspecified 10/25/2014  . Tdap 06/27/2009   gets yearly flu shots at work Last Pap smear: UTD with GYN, UTD Last mammogram: 2007  Last colonoscopy: never  Last DEXA: never  Dentist: every 6 months  Ophtho: yearly  Exercise: 4x/week at gym, 60 minutes of cardio and weights. H/o borderline cholesterol in the past, improved with dietary changes. She continues to try and eat healthy, but since living with her boyfriend, has likely made some changes that are less healthy. She would like to have the cholesterol rechecked.  Past Medical History  Diagnosis Date  . Dysmenorrhea   . Fibrocystic breast disease   . Allergy     seasonal  . Ovarian cyst 02/2011  . Mononucleosis 02/2011    Past Surgical History  Procedure Laterality Date  . Wisdom tooth extraction    . Laparoscopy  03/07/2011    Procedure: LAPAROSCOPY OPERATIVE;  Surgeon: Robley Fries, MD;  Location: [facility];  Service: Gynecology;  Laterality: Left;  Diagnostic laparoscopy and drainage of left ovarian cyst. Repair of cervical laceration.    Social History    Social History  . Marital Status: Single    Spouse Name: N/A  . Number of Children: 0  . Years of Education: N/A   Occupational History  . product manager [employer]   Social History Main Topics  . Smoking status: Never Smoker   . Smokeless tobacco: Never Used  . Alcohol Use: 0.0 oz/week    0 Standard drinks or equivalent per week     Comment: socially 3-4 drinks on weekends  . Drug Use: No  . Sexual Activity:    Partners: Male    Birth Control/ Protection: Pill   Other Topics Concern  . Not on file   Social History Narrative   Lives with her boyfriend. Family lives in town.  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  Company is being sold next month, some changes coming (benefits, vacation, etc) 01/2015       Family History  Problem Relation Age of Onset  . Thyroid disease Mother   . Hyperlipidemia Mother   . Hyperlipidemia Father   . Vision loss Father     blind in L eye (?CVA?)  . Cancer Father     skin  . Diabetes Maternal Uncle   . Cancer Paternal Grandfather     bladder cancer  . Hyperlipidemia Sister   . Cancer Maternal Aunt     lung (smoker)    Outpatient Encounter Prescriptions as of 01/17/2015  Medication Sig  Note  . levonorgestrel-ethinyl estradiol (AVIANE,ALESSE,LESSINA) 0.1-20 MG-MCG tablet Take 1 tablet by mouth daily.   . Multiple Vitamin (MULTIVITAMIN) tablet Take 1 tablet by mouth daily. 01/17/2015: Takes a chew daily  . diphenhydramine-acetaminophen (TYLENOL PM) 25-500 MG TABS Take 1 tablet by mouth at bedtime as needed. 01/17/2015: Uses prn for sleep  . Passion Flower POWD 1 capsule by Does not apply route daily. 01/17/2015: Uses rarely for anxiety  . [DISCONTINUED] amoxicillin (AMOXIL) 875 MG tablet Take 1 tablet (875 mg total) by mouth 2 (two) times daily.   . [DISCONTINUED] fluconazole (DIFLUCAN) 150 MG tablet Take 1 tablet (150 mg total) by mouth once.   . [DISCONTINUED] zolpidem (AMBIEN) 10 MG tablet Take 0.5-1 tablets (5-10  mg total) by mouth at bedtime as needed for sleep. 09/07/2013: Uses it sporadically, just twice in the last 2 weeks (1/3 tablet was effective)   No facility-administered encounter medications on file as of 01/17/2015.   No Known Allergies  ROS: The patient denies anorexia, fever, headaches (just slight one yesterday), vision changes, decreased hearing, ear pain, sore throat, breast concerns, chest pain, palpitations, dizziness, syncope, dyspnea on exertion, cough, swelling, nausea, vomiting, diarrhea, constipation, abdominal pain, melena, hematochezia, indigestion/heartburn, hematuria, incontinence, dysuria, vaginal discharge, odor or itch, genital lesions, numbness, tingling, weakness, tremor, suspicious skin lesions, depression, abnormal bleeding/bruising, or enlarged lymph nodes. Occasionally has some trouble sleeping, related to sometimes having to work at night.  Denies depression +intentional weight loss.  PHYSICAL EXAM:  BP 120/76 mmHg  Pulse 68  Ht 5' 4.5" (1.638 m)  Wt 171 lb (77.565 kg)  BMI 28.91 kg/m2  LMP 01/16/2015  General Appearance:  Alert, cooperative, no distress, appears stated age   Head:  Normocephalic, without obvious abnormality, atraumatic   Eyes:  PERRL, conjunctiva/corneas clear, EOM's intact, fundi  benign   Ears:  Normal TM's and external ear canals   Nose:  Nares normal, mucosa mildly edematous, no erythema, no drainage or sinus tenderness   Throat:  Lips, mucosa, and tongue normal; teeth and gums normal   Neck:  Supple, no lymphadenopathy; thyroid: no enlargement/tenderness/nodules; no carotid  bruit or JVD   Back:  Spine nontender, no curvature, ROM normal, no CVA tenderness   Lungs:  Clear to auscultation bilaterally without wheezes, rales or ronchi; respirations unlabored   Chest Wall:  No tenderness or deformity   Heart:  Regular rate and rhythm, S1 and S2 normal, no murmur, rub  or gallop   Breast Exam:  Deferred to GYN    Abdomen:  Soft, non-tender, nondistended, normoactive bowel sounds,  no masses, no hepatosplenomegaly   Genitalia:  Deferred to GYN      Extremities:  No clubbing, cyanosis or edema. FROM  Pulses:  2+ and symmetric all extremities   Skin:  Skin color, texture, turgor normal, no rashes or lesions   Lymph nodes:  Cervical, supraclavicular, and axillary nodes normal   Neurologic:  CNII-XII intact, normal strength, sensation and gait; reflexes 2+ and symmetric throughout   Psych: Normal mood, affect, hygiene and grooming.         ASSESSMENT/PLAN:  Annual physical exam - Plan: POCT Urinalysis Dipstick, Lipid panel  History of hypercholesterolemia - Plan: Lipid panel  Per patient request, recheck lipids today, given some dietary changes since last check. Last ate 4 hours ago.  She was shown stretches to do after running, including IT band stretches.  Discussed monthly self breast exams and yearly mammograms after the age of 66; at least 30 minutes  of aerobic activity at least 5 days/week, weight-bearing exercise 2x/wk; proper sunscreen use reviewed; healthy diet, including goals of calcium and vitamin D intake and alcohol recommendations (less than or equal to 1 drink/day) reviewed; regular seatbelt use; changing batteries in smoke detectors. Immunization recommendations discussed--continue yearly flu shots

## 2015-01-18 LAB — LIPID PANEL
CHOL/HDL RATIO: 3.4 ratio (ref <=5.0)
CHOLESTEROL: 164 mg/dL (ref 125–200)
HDL: 48 mg/dL (ref >=46)
LDL Cholesterol: 104 mg/dL (ref <130)
Triglycerides: 59 mg/dL (ref <150)
VLDL: 12 mg/dL (ref <30)

## 2015-01-24 ENCOUNTER — Encounter: Payer: Self-pay | Admitting: Family Medicine

## 2015-03-28 ENCOUNTER — Encounter: Payer: Self-pay | Admitting: Family Medicine

## 2015-03-28 ENCOUNTER — Ambulatory Visit (INDEPENDENT_AMBULATORY_CARE_PROVIDER_SITE_OTHER): Payer: Managed Care, Other (non HMO) | Admitting: Family Medicine

## 2015-03-28 VITALS — BP 118/76 | HR 72 | Temp 98.6°F | Ht 64.5 in | Wt 170.6 lb

## 2015-03-28 DIAGNOSIS — R059 Cough, unspecified: Secondary | ICD-10-CM

## 2015-03-28 DIAGNOSIS — R05 Cough: Secondary | ICD-10-CM

## 2015-03-28 DIAGNOSIS — J069 Acute upper respiratory infection, unspecified: Secondary | ICD-10-CM | POA: Diagnosis not present

## 2015-03-28 MED ORDER — HYDROCODONE-HOMATROPINE 5-1.5 MG/5ML PO SYRP: 5.0000 mL | ORAL_SOLUTION | Freq: Three times a day (TID) | ORAL | Status: DC | PRN

## 2015-03-28 MED ORDER — BENZONATATE 200 MG PO CAPS: 200.0000 mg | ORAL_CAPSULE | Freq: Three times a day (TID) | ORAL | Status: DC | PRN

## 2015-03-28 NOTE — Progress Notes (Signed)
Chief Complaint  Patient presents with  . Cough    mostly at night, much worse and cannot even sleep. Started last Wed with HA and just feeling out of it. Mucus is greenish/yellowish-did have some fever last week, none this week.    8 days ago she started with fatigue, headache, sore throat, then progressed to head congestion, runny nose, cough.  Nasal drainage is green or sometimes white/clear. Denies sinus pain.  Cough is worse at night, nonproductive.  Some chest congestion. Denies shortness of breath. +constantly cold (always), no chills.  Low grade fevers last week, resolved. Some sick contacts at work, and her friend's children have been sick.  OTC meds--4 types of sudafed, zicam nose sprays, nyquil.  PMH, PSH, SH reviewed  Outpatient Encounter Prescriptions as of 03/28/2015  Medication Sig Note  . diphenhydramine-acetaminophen (TYLENOL PM) 25-500 MG TABS Take 1 tablet by mouth at bedtime as needed. 01/17/2015: Uses prn for sleep  . levonorgestrel-ethinyl estradiol (AVIANE,ALESSE,LESSINA) 0.1-20 MG-MCG tablet Take 1 tablet by mouth daily.   . Multiple Vitamin (MULTIVITAMIN) tablet Take 1 tablet by mouth daily. 01/17/2015: Takes a chew daily  . Passion Flower POWD 1 capsule by Does not apply route daily. 01/17/2015: Uses rarely for anxiety   No facility-administered encounter medications on file as of 03/28/2015.   No Known Allergies  ROS:  Denies nausea, vomiting.  Some diarrhea last week, resolved. No bleeding, bruising, rash. Denies myalgias, arthralgias. +fatigue and URI symptoms as per HPI. No chest pain, shortness of breath or palpitations.  PHYSICAL EXAM: BP 118/76 mmHg  Pulse 72  Temp(Src) 98.6 F (37 C) (Tympanic)  Ht 5' 4.5" (1.638 m)  Wt 170 lb 9.6 oz (77.384 kg)  BMI 28.84 kg/m2  LMP 03/13/2015  Well developed, pleasant female, nasally congested, with some sniffling, rare cough HEENT: PERRL, EOMI, conjunctiva and sclera are clear. TM's and EAC's normal. Nasal mucosa  is moderately edematous R>L, with some yellow discharge noted on the right. Sinuses are nontender. OP is clear Neck: no lymphadenopathy or mass Heart: regular rate and rhythm without murmur Lungs: clear bilaterally. No wheezes, rales, ronchi Extremities: no edema Skin: normal turgor, no rash Neuro: alert and oriented. Cranial nerves intact, normal strength, gait Psych: normal mood, affect, hygiene and grooming  ASSESSMENT/PLAN:  Acute upper respiratory infection - supportive measures reviewed.   Cough - risks/side effects of meds reviewed - Plan: benzonatate (TESSALON) 200 MG capsule, HYDROcodone-homatropine (HYCODAN) 5-1.5 MG/5ML syrup   Drink plenty of water. Continue decongestants (I recommend 12 hour sudafed taking in the morning). You need to add guaifenesin to your regimen if it isn't part of any of the medications you are already taking.  I recommend getting a Mucinex  (max strength) 12 hour, and taking it twice daily. You may continue to use the Nyquil at night. Use tessalon perles during the day, if needed for cough (shouldn't make you tired). Use the hycodan syrup only at bedtime, so you can sleep and help with cough. Don't use the nyquil if you are using the hycodan syrup at bedtime  Call on Monday if you are clearly not improving--ongoing discolored mucus, fever recurs, sinus pain.

## 2015-03-28 NOTE — Patient Instructions (Signed)
  Drink plenty of water. Continue decongestants (I recommend 12 hour sudafed taking in the morning). You need to add guaifenesin to your regimen if it isn't part of any of the medications you are already taking.  I recommend getting a Mucinex  (max strength) 12 hour, and taking it twice daily. You may continue to use the Nyquil at night. Use tessalon perles during the day, if needed for cough (shouldn't make you tired). Use the hycodan syrup only at bedtime, so you can sleep and help with cough. Don't use the nyquil if you are using the hycodan syrup at bedtime  Call on Monday if you are clearly not improving--ongoing discolored mucus, fever recurs, sinus pain.

## 2015-12-02 ENCOUNTER — Ambulatory Visit (INDEPENDENT_AMBULATORY_CARE_PROVIDER_SITE_OTHER): Payer: Managed Care, Other (non HMO) | Admitting: Family Medicine

## 2015-12-02 ENCOUNTER — Encounter: Payer: Self-pay | Admitting: Family Medicine

## 2015-12-02 VITALS — BP 110/60 | HR 72 | Temp 99.3°F | Ht 64.75 in | Wt 174.6 lb

## 2015-12-02 DIAGNOSIS — R6883 Chills (without fever): Secondary | ICD-10-CM

## 2015-12-02 DIAGNOSIS — B349 Viral infection, unspecified: Secondary | ICD-10-CM | POA: Diagnosis not present

## 2015-12-02 DIAGNOSIS — R52 Pain, unspecified: Secondary | ICD-10-CM | POA: Diagnosis not present

## 2015-12-02 LAB — POC INFLUENZA A&B (BINAX/QUICKVUE)
INFLUENZA B, POC: NEGATIVE
Influenza A, POC: NEGATIVE

## 2015-12-02 NOTE — Progress Notes (Signed)
Chief Complaint  Patient presents with  . Sore Throat    and exhaustion that began yesterday. Terrible headache, and neck pain at the base of her neck. Has had chills. Unsure of if she has actually had temp. Wants to make sure she doesn't have flu as she is going on her honeymoon to [place], [place] this weekend.    She felt fine until 4:30 yesterday afternoon--started with fatigue and sore throat. She had tactile fevers (felt warm, no chills). She took sudafed, ibuprofen. Headache is improved with ibuprofen. Today she continues to feel fatigued, head feels heavy, slight neck pain.  She has a headache across her forehead. She has had a slight cough, dry and hacky, nonproductive.  No sick contacts.  Cleaned niece's vomit over a week ago, no other sick contacts  PMH, [place], [place] reviewed  Outpatient Encounter Prescriptions as of 12/02/2015  Medication Sig Note  . ibuprofen (ADVIL,MOTRIN) 200 MG tablet Take 400 mg by mouth every 6 (six) hours as needed. 12/02/2015: Last dose today @ noon.   Marland Kitchen levonorgestrel-ethinyl estradiol (AVIANE,ALESSE,LESSINA) 0.1-20 MG-MCG tablet Take 1 tablet by mouth daily.   . Multiple Vitamin (MULTIVITAMIN) tablet Take 1 tablet by mouth daily. 01/17/2015: Takes a chew daily  . Passion Flower POWD 1 capsule by Does not apply route daily. 01/17/2015: Uses rarely for anxiety  . pseudoephedrine (SUDAFED) 30 MG tablet Take 30 mg by mouth every 4 (four) hours as needed for congestion. 12/02/2015: Last dose last night.   . [DISCONTINUED] benzonatate (TESSALON) 200 MG capsule Take 1 capsule (200 mg total) by mouth 3 (three) times daily as needed.   . [DISCONTINUED] diphenhydramine-acetaminophen (TYLENOL PM) 25-500 MG TABS Take 1 tablet by mouth at bedtime as needed. 01/17/2015: Uses prn for sleep  . [DISCONTINUED] HYDROcodone-homatropine (HYCODAN) 5-1.5 MG/5ML syrup Take 5 mLs by mouth every 8 (eight) hours as needed for cough.    No facility-administered encounter medications on file as of  12/02/2015.    Also taking generic claritin daily  No Known Allergies  ROS: Denies nausea, vomiting.  She had some loose stool yesterday, normal today. Denies abdominal pain. Denies urinary complaints. Denies bleeding, bruising, rash, chest pain, shortness of breath or other complaints except as noted in HPI.  PHYSICAL EXAM:  BP 110/60 (BP Location: Left Arm, Patient Position: Sitting, Cuff Size: Normal)   Pulse 72   Temp 99.3 F (37.4 C) (Tympanic)   Ht 5' 4.75" (1.645 m)   Wt 174 lb 9.6 oz (79.2 kg)   BMI 29.28 kg/m   Well appearing, pleasant female in no distress HEENT: PERRL, EOMI, conjunctiva and sclera clear. TM's and EAC's normal. Nasal mucosa is mildly edematous, no erythema or drainage. Mildly tender at temporalis muscles bilaterally (admits to clenching teeth often). Nontender over sinuses.  OP is clear, normal tonsils Neck: no lymphadenopathy or mass Heart: regular rate and rhythm without murmur Lungs: clear bilaterally   Influenza tests negative for A&B  ASSESSMENT/PLAN:  Viral syndrome  Chills - Plan: POC Influenza A&B (Binax test)  Body aches - Plan: POC Influenza A&B (Binax test)   Drink plenty of water. Continue ibuprofen and/or tylenol as needed for pain and fever. Continue decongestants as needed for sinus pain. Consider sinus rinses (neti-pot or sinus rinse kit). Consider adding guaifenesin (robitussin or mucinex) to help loosen any thick phlegm. Use sudafed prior to your flight. Consider also using an Afrin nasal spray prior to flight if having any ear plugging or popping.  Contact us if your symptoms  change.  Continue to take your allergy medication.

## 2015-12-02 NOTE — Patient Instructions (Signed)
  Drink plenty of water. Continue ibuprofen and/or tylenol as needed for pain and fever. Continue decongestants as needed for sinus pain. Consider sinus rinses (neti-pot or sinus rinse kit). Consider adding guaifenesin (robitussin or mucinex) to help loosen any thick phlegm. Use sudafed prior to your flight. Consider also using an Afrin nasal spray prior to flight if having any ear plugging or popping.  Contact us if your symptoms change.  Continue to take your allergy medication.

## 2016-02-18 NOTE — Progress Notes (Signed)
Chief Complaint  Patient presents with  . Annual Exam    fasting annual exam no pap-sees Dr.Mody and is UTD. Is thinking she needs a referral to derm for her skin. Hands and feet are always cold and loses feeling sometimes.    Catherine Hill is a 31 y.o. female who presents for a complete physical.   She has been having increased dryness around the nose, nasolabial folds and chin.  The dryness has improved, but she feels like her pores are "clogged" at her forehead and chin.   She gets facials monthly at [facility]--doesn't think any products there contribute. Wondering if she needs to see a dermatologist for her skin concerns.  She reports being constantly cold, worsening as she gets older.  She noticed with the cold weather that her toes would go numb shortly after being outside, as would her hands.  Has NOT seen any color changes (her sister has Raynaud's and sees that--she has not).  Questions if her circulation is okay.  She got married 11/09/15, moved into a house, and got a puppy earlier this week. She started running last year (ran 10K). Now she does [employer] (pilates, but also does the class with cardio).  Plans to restart running this Spring.   She sees GYN yearly.  She is on OCP's for contraception, and her cycles are regular and light.    She would like to have cholesterol rechecked. Husband lost weight and has been eating better--she thinks her lipids will be better, not worse.   Immunization History  Administered Date(s) Administered  . DTaP 09/15/1999  . HPV Quadrivalent 09/17/2005, 07/10/2008, 06/27/2009  . Influenza Split 12/11/2010  . Influenza-Unspecified 10/25/2014  . Tdap 06/27/2009   gets yearly flu shots at work Last Pap smear: UTD with GYN, 10/2015 Last mammogram: 2007  Last colonoscopy: never  Last DEXA: never  Dentist: every 6 months  Ophtho: yearly  Exercise: 5-6x/week at [employer],  3 classes are cardio and weights, others are more of a cardio  pilates. H/o borderline cholesterol in the past, improved with dietary changes.  Lab Results  Component Value Date   CHOL 164 01/17/2015   HDL 48 01/17/2015   LDLCALC 104 01/17/2015   TRIG 59 01/17/2015   CHOLHDL 3.4 01/17/2015   Past Medical History:  Diagnosis Date  . Allergy    seasonal  . Dysmenorrhea   . Fibrocystic breast disease   . Mononucleosis 02/2011  . Ovarian cyst 02/2011    Past Surgical History:  Procedure Laterality Date  . LAPAROSCOPY  03/07/2011   Procedure: LAPAROSCOPY OPERATIVE;  Surgeon: Robley FriesVaishali R Mody, MD;  Location: [facility];  Service: Gynecology;  Laterality: Left;  Diagnostic laparoscopy and drainage of left ovarian cyst. Repair of cervical laceration.  . WISDOM TOOTH EXTRACTION      Social History   Social History  . Marital status: Married    Spouse name: N/A  . Number of children: 0  . Years of education: N/A   Occupational History  . 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 Smoking status: Never Smoker  . Smokeless tobacco: Never Used  . Alcohol use 0.0 oz/week     Comment: 2-3 drinks/month  . Drug use: No  . Sexual activity: Yes    Partners: Male    Birth control/ protection: Pill   Other Topics Concern  . Not on file   Social History Narrative   Married (11/09/15). Family lives in town.  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   1 dog (puppy "Tug", 02/2016)       Family History  Problem Relation Age of Onset  . Thyroid disease Mother   . Hyperlipidemia Mother   . Hyperlipidemia Father   . Vision loss Father     blind in L eye (?CVA?)  . Cancer Father     skin  . Hyperlipidemia Sister   . Cancer Maternal Aunt     lung (smoker)  . Diabetes Maternal Uncle   . Cancer Paternal Grandfather     bladder cancer    Outpatient Encounter Prescriptions as of 02/20/2016  Medication Sig Note  . levonorgestrel-ethinyl estradiol (AVIANE,ALESSE,LESSINA) 0.1-20 MG-MCG  tablet Take 1 tablet by mouth daily.   . Multiple Vitamin (MULTIVITAMIN) tablet Take 1 tablet by mouth daily. 01/17/2015: Takes a chew daily  . cetirizine (ZYRTEC) 10 MG tablet Take 10 mg by mouth daily.   . fluticasone (FLONASE) 50 MCG/ACT nasal spray Place 2 sprays into both nostrils daily. 02/20/2016: Received from: External Pharmacy Received Sig: USE 2 SPRAYS IN EACH NOSTRIL DAILY  . ibuprofen (ADVIL,MOTRIN) 200 MG tablet Take 400 mg by mouth every 6 (six) hours as needed.   . Passion Flower POWD 1 capsule by Does not apply route daily. 01/17/2015: Uses rarely for anxiety  . pseudoephedrine (SUDAFED) 30 MG tablet Take 30 mg by mouth every 4 (four) hours as needed for congestion.    No facility-administered encounter medications on file as of 02/20/2016.     No Known Allergies  ROS: The patient denies anorexia, weight changes, fever, headaches, vision changes, decreased hearing, ear pain, sore throat, breast concerns, chest pain, palpitations, dizziness, syncope, dyspnea on exertion, cough, swelling, nausea, vomiting, diarrhea, constipation, abdominal pain, melena, hematochezia, indigestion/heartburn, hematuria, incontinence, dysuria, vaginal discharge, odor or itch, genital lesions, numbness, tingling, weakness, tremor, suspicious skin lesions, depression, abnormal bleeding/bruising, or enlarged lymph nodes. Denies depression, anxiety has improved/lessened. Some intermittent loose stools, which she relates to IBS.   PHYSICAL EXAM:  BP 120/70 (BP Location: Right Arm, Patient Position: Sitting, Cuff Size: Normal)   Pulse 72   Ht 5' 4.75" (1.645 m)   Wt 172 lb 3.2 oz (78.1 kg)   LMP 02/11/2016   BMI 28.88 kg/m   General Appearance:  Alert, cooperative, no distress, appears stated age   Head:  Normocephalic, without obvious abnormality, atraumatic   Eyes:  PERRL, conjunctiva/corneas clear, EOM's intact, fundi  benign   Ears:  Normal TM's and external ear canals   Nose:  Nares  normal, mucosa is normal, no erythema, no drainage or sinus tenderness   Throat:  Lips, mucosa, and tongue normal; teeth and gums normal   Neck:  Supple, no lymphadenopathy; thyroid: no enlargement/tenderness/nodules; no carotid bruit or JVD   Back:  Spine nontender, no curvature, ROM normal, no CVA tenderness   Lungs:  Clear to auscultation bilaterally without wheezes, rales or ronchi; respirations unlabored   Chest Wall:  No tenderness or deformity   Heart:  Regular rate and rhythm, S1 and S2 normal, no murmur, rub or gallop   Breast Exam:  Deferred to GYN   Abdomen:  Soft, non-tender, nondistended, normoactive bowel sounds,  no masses, no hepatosplenomegaly   Genitalia:  Deferred to GYN      Extremities:  No clubbing, cyanosis or edema. FROM  Pulses:  2+ and symmetric all extremities   Skin:  Skin color, texture, turgor normal, no rashes or  lesions; see below for facial exam   Lymph nodes:  Cervical, supraclavicular, and axillary nodes normal   Neurologic:  CNII-XII intact, normal strength, sensation and gait; reflexes 2+ and symmetric throughout   Psych: bNormal mood, affect, hygiene and grooming  Skin--wearing makeup.  Skin feels somewhat rough/bumpy at forehead and chin--no blackheads noted (has makeup covering), but suspect some are likely present. A few flesh-colored papules (<33mm) also on right cheek.   ASSESSMENT/PLAN:  Annual physical exam - Plan: POCT Urinalysis Dipstick, TSH, CBC with Differential/Platelet, Lipid panel  Comedonal acne - Plan: tretinoin (RETIN-A) 0.025 % cream   Lipids, CBC, TSH  Trial of retin-A.  If tolerating but inadequate response after 2-3 months, can increase strength. 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  May try OTC HC prn recurrence.   Discussed monthly self breast exams and yearly mammograms after the age of 78; at least 30 minutes of aerobic activity at least 5  days/week, weight-bearing exercise 2x/wk; proper sunscreen use reviewed; healthy diet, including goals of calcium and vitamin D intake and alcohol recommendations (less than or equal to 1 drink/day) reviewed; regular seatbelt use; changing batteries in smoke detectors. Immunization recommendations discussed--continue yearly flu shots  F/u 1 year, sooner prn

## 2016-02-20 ENCOUNTER — Ambulatory Visit (INDEPENDENT_AMBULATORY_CARE_PROVIDER_SITE_OTHER): Payer: Managed Care, Other (non HMO) | Admitting: Family Medicine

## 2016-02-20 ENCOUNTER — Encounter: Payer: Self-pay | Admitting: Family Medicine

## 2016-02-20 VITALS — BP 120/70 | HR 72 | Ht 64.75 in | Wt 172.2 lb

## 2016-02-20 DIAGNOSIS — Z0000 Encounter for general adult medical examination without abnormal findings: Secondary | ICD-10-CM

## 2016-02-20 DIAGNOSIS — L700 Acne vulgaris: Secondary | ICD-10-CM | POA: Diagnosis not present

## 2016-02-20 LAB — POCT URINALYSIS DIPSTICK
Bilirubin, UA: NEGATIVE
Glucose, UA: NEGATIVE
Ketones, UA: NEGATIVE
Leukocytes, UA: NEGATIVE
Nitrite, UA: NEGATIVE
PROTEIN UA: NEGATIVE
RBC UA: NEGATIVE
UROBILINOGEN UA: NEGATIVE
pH, UA: 5.5

## 2016-02-20 LAB — CBC WITH DIFFERENTIAL/PLATELET
BASOS PCT: 1 %
Basophils Absolute: 59 cells/uL (ref 0–200)
EOS ABS: 59 {cells}/uL (ref 15–500)
Eosinophils Relative: 1 %
HCT: 42 % (ref 35.0–45.0)
Hemoglobin: 13.8 g/dL (ref 11.7–15.5)
LYMPHS PCT: 38 %
Lymphs Abs: 2242 cells/uL (ref 850–3900)
MCH: 30.9 pg (ref 27.0–33.0)
MCHC: 32.9 g/dL (ref 32.0–36.0)
MCV: 94.2 fL (ref 80.0–100.0)
MONOS PCT: 8 %
MPV: 10.3 fL (ref 7.5–12.5)
Monocytes Absolute: 472 cells/uL (ref 200–950)
NEUTROS ABS: 3068 {cells}/uL (ref 1500–7800)
Neutrophils Relative %: 52 %
PLATELETS: 278 10*3/uL (ref 140–400)
RBC: 4.46 MIL/uL (ref 3.80–5.10)
RDW: 12.4 % (ref 11.0–15.0)
WBC: 5.9 10*3/uL (ref 4.0–10.5)

## 2016-02-20 LAB — LIPID PANEL
CHOL/HDL RATIO: 2.9 ratio (ref <5.0)
CHOLESTEROL: 173 mg/dL (ref <200)
HDL: 59 mg/dL (ref >50)
LDL Cholesterol: 105 mg/dL — ABNORMAL HIGH (ref <100)
Triglycerides: 47 mg/dL (ref <150)
VLDL: 9 mg/dL (ref <30)

## 2016-02-20 LAB — TSH: TSH: 1.15 mIU/L

## 2016-02-20 MED ORDER — TRETINOIN 0.025 % EX CREA: TOPICAL_CREAM | Freq: Every day | CUTANEOUS | 5 refills | Status: DC

## 2016-02-20 NOTE — Patient Instructions (Signed)
  HEALTH MAINTENANCE RECOMMENDATIONS:  It is recommended that you get at least 30 minutes of aerobic exercise at least 5 days/week (for weight loss, you may need as much as 60-90 minutes). This can be any activity that gets your heart rate up. This can be divided in 10-15 minute intervals if needed, but try and build up your endurance at least once a week.  Weight bearing exercise is also recommended twice weekly.  Eat a healthy diet with lots of vegetables, fruits and fiber.  "Colorful" foods have a lot of vitamins (ie green vegetables, tomatoes, red peppers, etc).  Limit sweet tea, regular sodas and alcoholic beverages, all of which has a lot of calories and sugar.  Up to 1 alcoholic drink daily may be beneficial for women (unless trying to lose weight, watch sugars).  Drink a lot of water.  Calcium recommendations are 1200-1500 mg daily (1500 mg for postmenopausal women or women without ovaries), and vitamin D 1000 IU daily.  This should be obtained from diet and/or supplements (vitamins), and calcium should not be taken all at once, but in divided doses.  Monthly self breast exams and yearly mammograms for women over the age of 31 is recommended.  Sunscreen of at least SPF 30 should be used on all sun-exposed parts of the skin when outside between the hours of 10 am and 4 pm (not just when at beach or pool, but even with exercise, golf, tennis, and yard work!)  Use a sunscreen that says "broad spectrum" so it covers both UVA and UVB rays, and make sure to reapply every 1-2 hours.  Remember to change the batteries in your smoke detectors when changing your clock times in the spring and fall.  Use your seat belt every time you are in a car, and please drive safely and not be distracted with cell phones and texting while driving.  Start using the Retin-A to affected areas of skin every night.  If no effect after 2-3 months, but not too drying, we can increase the strength. I suspect the dryness you  mentioned around the nose might be seborrheic dermatitis--try an OTC hydrocortisone cream just as needed if it recurs.

## 2016-05-14 ENCOUNTER — Encounter: Payer: Managed Care, Other (non HMO) | Admitting: Family Medicine

## 2017-02-24 ENCOUNTER — Encounter: Payer: Managed Care, Other (non HMO) | Admitting: Family Medicine

## 2017-04-21 ENCOUNTER — Telehealth: Payer: Self-pay | Admitting: Family Medicine

## 2017-04-21 NOTE — Telephone Encounter (Signed)
Left message informing patient.

## 2017-04-21 NOTE — Telephone Encounter (Signed)
She should be using decongestants, as well as continuing her allergy meds (flonase, zyrtec). She can also try sinus rinses.  It can take a few days of antibiotics before starting to feel better.  Schedule visit for tomorrow if getting worse

## 2017-04-21 NOTE — Telephone Encounter (Signed)
Pt has a sinus infection and was diagnosed by a teledoc and given Amoxicillin that she started yesterday however pt is severely congested, one nostril is completed clogged and only blood comes out, her ear is blocked and she has been using hot compresses on it but its not helping, discharge is coming out of eye, she is slightly dizzy. Pt wants to know what she needs to do?

## 2017-04-23 ENCOUNTER — Ambulatory Visit (INDEPENDENT_AMBULATORY_CARE_PROVIDER_SITE_OTHER): Payer: 59 | Admitting: Medical

## 2017-04-23 ENCOUNTER — Encounter: Payer: Self-pay | Admitting: Medical

## 2017-04-23 VITALS — BP 110/80 | HR 79 | Temp 98.3°F | Wt 181.2 lb

## 2017-04-23 DIAGNOSIS — J111 Influenza due to unidentified influenza virus with other respiratory manifestations: Secondary | ICD-10-CM

## 2017-04-23 DIAGNOSIS — J0100 Acute maxillary sinusitis, unspecified: Secondary | ICD-10-CM

## 2017-04-23 DIAGNOSIS — H6591 Unspecified nonsuppurative otitis media, right ear: Secondary | ICD-10-CM

## 2017-04-23 DIAGNOSIS — H1031 Unspecified acute conjunctivitis, right eye: Secondary | ICD-10-CM | POA: Diagnosis not present

## 2017-04-23 MED ORDER — HYDROCODONE-ACETAMINOPHEN 5-325 MG PO TABS: 1.0000 | ORAL_TABLET | Freq: Four times a day (QID) | ORAL | 0 refills | Status: DC | PRN

## 2017-04-23 MED ORDER — POLYMYXIN B-TRIMETHOPRIM 10000-0.1 UNIT/ML-% OP SOLN: 1.0000 [drp] | OPHTHALMIC | 0 refills | Status: DC

## 2017-04-23 MED ORDER — PREDNISONE 10 MG PO TABS: ORAL_TABLET | ORAL | 0 refills | Status: DC

## 2017-04-23 NOTE — Progress Notes (Signed)
Subjective: Chief Complaint  Patient presents with  . congestion, sinus pressure    congestion, sinus pressure, she was dx with flu on wednesdy    Here for illness.  Started feeling bad about 8 days ago, achy, chills, headache, sore throat, sinus pressure.   Ended up going to urgent care 2 days ago.  Diagnosed with flu and sinus infection.  Was put on Augmentin.  She is already taking Allegra D and tylenol.  However, just not feeling much better.  Has right side facial pain, ear pressure, feels like helicopter sound in right ear.   Ongoing sinus pressure.   In last day have redness of right eye, watery eye.  Feels horrible, just not seeing improvement.  No fever, no NVD.    She is on fertility treatments, and they are aware of her illness.  No other aggravating or relieving factors. No other complaint.  Past Medical History:  Diagnosis Date  . Allergy    seasonal  . Dysmenorrhea   . Fibrocystic breast disease   . Mononucleosis 02/2011  . Ovarian cyst 02/2011   Current Outpatient Medications on File Prior to Visit  Medication Sig Dispense Refill  . cetirizine (ZYRTEC) 10 MG tablet Take 10 mg by mouth daily.    . Multiple Vitamin (MULTIVITAMIN) tablet Take 1 tablet by mouth daily.    . pseudoephedrine (SUDAFED) 30 MG tablet Take 30 mg by mouth every 4 (four) hours as needed for congestion.    Marland Kitchen. tretinoin (RETIN-A) 0.025 % cream Apply topically at bedtime. 45 g 5  . fluticasone (FLONASE) 50 MCG/ACT nasal spray Place 2 sprays into both nostrils daily.  1  . ibuprofen (ADVIL,MOTRIN) 200 MG tablet Take 400 mg by mouth every 6 (six) hours as needed.    Marland Kitchen. levonorgestrel-ethinyl estradiol (AVIANE,ALESSE,LESSINA) 0.1-20 MG-MCG tablet Take 1 tablet by mouth daily. (Patient not taking: Reported on 04/23/2017) 28 tablet 2  . Passion Flower POWD 1 capsule by Does not apply route daily.     No current facility-administered medications on file prior to visit.    ROS as in subjective   Objective: BP  110/80   Pulse 79   Temp 98.3 F (36.8 C)   Wt 181 lb 3.2 oz (82.2 kg)   SpO2 96%   BMI 30.39 kg/m   General appearance: wd, wn, alert,somewhat ill appearing No facial or orbital erythema or signs of celluliits Left eye: Normal conjunctiva, no discharge or watery Right eye: Conjunctiva injected, watery discharge PERRLA, EOMi, eyes otherwise unremarkable Ears: Bulging erythematous right TM, left TM normal, canals normal Nares bilaterally with erythema, turbinate swelling, and thick mucus on the right Pharynx normal, mild erythema Oral cavity: MMM, no lesions Neck: supple, no lymphadenopathy, no thyromegaly, no masses Lungs clear  heart RRR, normal S1, S2, no murmurs        Assessment: Encounter Diagnoses  Name Primary?  . Mucoid otitis media of right ear, unspecified chronicity Yes  . Acute maxillary sinusitis, recurrence not specified   . Influenza   . Acute conjunctivitis of right eye, unspecified acute conjunctivitis type      Plan: We discussed her symptoms and exam findings.  Advised to continue Augmentin antibiotic that she started 2 days ago.  She appears to have a right ear infection and conjunctivitis as well.  We discussed the usual timeframe to see improvements.  Begin or add on medications below to help clear out the infection.  Rest, hydrate well, can do gentle nasal saline flush with  salt water.  She will call her fertility doctor to make sure they are okay with the medication below.  She is currently on fertility treatment but not pregnant.  Advised if worse or not stand some improvement in the next 48 hours to call or get re-evaluated.  Kissa was seen today for congestion, sinus pressure.  Diagnoses and all orders for this visit:  Mucoid otitis media of right ear, unspecified chronicity  Acute maxillary sinusitis, recurrence not specified  Influenza  Acute conjunctivitis of right eye, unspecified acute conjunctivitis type  Other orders -      predniSONE (DELTASONE) 10 MG tablet; 6/5/4/3/2/1 -     trimethoprim-polymyxin b (POLYTRIM) ophthalmic solution; Place 1 drop into the right eye every 4 (four) hours. -     HYDROcodone-acetaminophen (NORCO) 5-325 MG tablet; Take 1 tablet by mouth every 6 (six) hours as needed for moderate pain.

## 2017-04-30 ENCOUNTER — Other Ambulatory Visit: Payer: Self-pay | Admitting: Medical

## 2017-04-30 ENCOUNTER — Telehealth: Payer: Self-pay | Admitting: Medical

## 2017-04-30 MED ORDER — AZITHROMYCIN 250 MG PO TABS: ORAL_TABLET | ORAL | 0 refills | Status: DC

## 2017-04-30 NOTE — Telephone Encounter (Signed)
Pt called and stated that she is still sick. She states that she is some better but is now out of medication and continues to have yellow drainage. Please advise pt at [phone]. Pt uses [facility] on [address].

## 2017-04-30 NOTE — Telephone Encounter (Signed)
zpak antibiotic sent 

## 2017-04-30 NOTE — Telephone Encounter (Signed)
[facility]. Pt called for refill of albuterol. He states that he hasn't had to refill this in a while but it has now expired. Please send refill to [facility], [facility]. Pt can be reached at [phone].

## 2017-04-30 NOTE — Telephone Encounter (Signed)
Is she using mucinex or any kind of mucous relief medication?  I would recommend that a few more days along with nasal saline flush.   I assume she did a whole 10 day course of Augmentin correct?   This is a heavy duty antibiotic.   If she only feels 50% improved I'll send a different antibiotic, but if 80% or better, I would just give it more time to resolve.

## 2017-04-30 NOTE — Telephone Encounter (Signed)
Called patient and she is doing the mucous relief medication, and the nasal flush. States she is only about 65% better.

## 2017-09-13 ENCOUNTER — Ambulatory Visit (INDEPENDENT_AMBULATORY_CARE_PROVIDER_SITE_OTHER): Payer: 59 | Admitting: Family Medicine

## 2017-09-13 ENCOUNTER — Encounter: Payer: Self-pay | Admitting: Family Medicine

## 2017-09-13 VITALS — BP 110/70 | HR 78 | Temp 98.1°F | Resp 16 | Ht 65.0 in | Wt 179.6 lb

## 2017-09-13 DIAGNOSIS — K529 Noninfective gastroenteritis and colitis, unspecified: Secondary | ICD-10-CM | POA: Diagnosis not present

## 2017-09-13 NOTE — Progress Notes (Signed)
Chief Complaint  Patient presents with  . dairrhea    diarrhea X friday at 2pm   fever     Patient presents with complaint of diarrhea x 3 days.  Recent travel included going away mid-July to [facility] for a week, then [employer] for a week.  Back at work last week. Friday 8/2 went to [employer], shared a pizza with a group (none of whom got sick), and she also got a salad.  Immediately didn't feel well after work. Started with diarrhea that afternoon. Watery, explosive diarrhea 2x at work, left early, many more episodes of diarrhea. T99.8.  Diarrhea has slowed down, but eating saltines and toast. "liquid IV" powder, for hydration. Last night she only woke up once, but has had 3 episodes today. Had grilled chicken and toast for lunch--diarrhea 25 mins later.  Fevers resolved, just the first 24 hours. Saturday night one episode felt like IBS attack--cramping, cold sweats.  Stools remain brown liquid, somewhat mucousy.  Not bloody. Has cramp prior to have diarrhea, otherwise no pain. Just slight tightness.  Slight nausea Fri and Sat only, never vomited.  No camping, no antibiotics, no travel out of the country. No raw/spoiled/undercooked foods Thurs had Gh[place], didn't feel great that afternoon +fish on vacation, all cooked.  She lost power 12 hrs last week. Threw eggs away, mayo, butter  Currently on hormonal treatment prior to IVF.  PMH, PSH, SH reviewed  Outpatient Encounter Medications as of 09/13/2017  Medication Sig Note  . cetirizine (ZYRTEC) 10 MG tablet Take 10 mg by mouth daily.   Marland Kitchen ibuprofen (ADVIL,MOTRIN) 200 MG tablet Take 400 mg by mouth every 6 (six) hours as needed.   Marland Kitchen letrozole (FEMARA) 2.5 MG tablet Take by mouth.   . Multiple Vitamin (MULTIVITAMIN) tablet Take 1 tablet by mouth daily. 01/17/2015: Takes a chew daily  . progesterone (PROMETRIUM) 100 MG capsule Take 100 capsules by mouth once.   . fluticasone (FLONASE) 50 MCG/ACT nasal spray Place 2 sprays into  both nostrils daily. 02/20/2016: Received from: External Pharmacy Received Sig: USE 2 SPRAYS IN EACH NOSTRIL DAILY  . [DISCONTINUED] azithromycin (ZITHROMAX) 250 MG tablet 2 tablets day 1, then 1 tablet days 2-4   . [DISCONTINUED] HYDROcodone-acetaminophen (NORCO) 5-325 MG tablet Take 1 tablet by mouth every 6 (six) hours as needed for moderate pain.   . [DISCONTINUED] levonorgestrel-ethinyl estradiol (AVIANE,ALESSE,LESSINA) 0.1-20 MG-MCG tablet Take 1 tablet by mouth daily. (Patient not taking: Reported on 04/23/2017)   . [DISCONTINUED] Passion Flower POWD 1 capsule by Does not apply route daily. 01/17/2015: Uses rarely for anxiety  . [DISCONTINUED] pseudoephedrine (SUDAFED) 30 MG tablet Take 30 mg by mouth every 4 (four) hours as needed for congestion.   . [DISCONTINUED] tretinoin (RETIN-A) 0.025 % cream Apply topically at bedtime.   . [DISCONTINUED] trimethoprim-polymyxin b (POLYTRIM) ophthalmic solution Place 1 drop into the right eye every 4 (four) hours.    No facility-administered encounter medications on file as of 09/13/2017.    On prometrium for over a year, on Femara 5 days/month since January. Planning for IVF with her next cycle.  No Known Allergies  ROS:  Low grade fever, resolved. No vomiting. +watery diarrhea. No urinary complaints, vaginal discharge, headache, dizziness, chest pain, shortness of breath. No bleeding, bruising, rash   PHYSICAL EXAM:  BP 110/70   Pulse 78   Temp 98.1 F (36.7 C) (Oral)   Resp 16   Ht _0  (1.651 m)   Wt 179 lb 9.6  oz (81.5 kg)   SpO2 98%   BMI 29.89 kg/m   Pleasant, well-appearing female, in no distress HEENT: conjunctiva and sclera are clear, EOMI. Moist mucus membranes, OP is clear Neck: no lymphadenopathy or mass Heart: regular rate and rhythm Lungs: clear bilaterally Abdomen: normal bowel sounds, soft. Minimally tender in epigastrium. No rebound, guarding, masses.   Skin: normal turgor, no rash Psych: normal mood, affect, hygiene  and grooming Neuro: alert and oriented, cranial nerves intact, normal gait   ASSESSMENT/PLAN:  Acute gastroenteritis - supportive measures reviewed. return if increasing pain, fever, blood in stool, s/sx dehydration. Imodium, lactose-free, BRAT diet   Drink plenty of water. Avoid all dairy for at least 5 days. BRAT diet Use Imodium as needed for diarrhea and start a probiotic.  Contact us if fever, bloody stool, worsening abdominal pain, you will need to be re-evaluated. If none of those symptoms, but diarrhea persists, contact us for stool studies to be done (you will need to pick up collection kit).

## 2017-09-13 NOTE — Patient Instructions (Signed)
Drink plenty of water. Avoid all dairy for at least 5 days. BRAT diet Use Imodium as needed for diarrhea and start a probiotic.  Contact us if fever, bloody stool, worsening abdominal pain, you will need to be re-evaluated. If none of those symptoms, but diarrhea persists, contact us for stool studies to be done (you will need to pick up collection kit).   Food Choices to Help Relieve Diarrhea, Adult When you have diarrhea, the foods you eat and your eating habits are very important. Choosing the right foods and drinks can help:  Relieve diarrhea.  Replace lost fluids and nutrients.  Prevent dehydration.  What general guidelines should I follow? Relieving diarrhea  Choose foods with less than 2 g or .07 oz. of fiber per serving.  Limit fats to less than 8 tsp (38 g or 1.34 oz.) a day.  Avoid the following: ? Foods and beverages sweetened with high-fructose corn syrup, honey, or sugar alcohols such as xylitol, sorbitol, and mannitol. ? Foods that contain a lot of fat or sugar. ? Fried, greasy, or spicy foods. ? High-fiber grains, breads, and cereals. ? Raw fruits and vegetables.  Eat foods that are rich in probiotics. These foods include dairy products such as yogurt and fermented milk products. They help increase healthy bacteria in the stomach and intestines (gastrointestinal tract, or GI tract).  If you have lactose intolerance, avoid dairy products. These may make your diarrhea worse.  Take medicine to help stop diarrhea (antidiarrheal medicine) only as told by your health care provider. Replacing nutrients  Eat small meals or snacks every 3-4 hours.  Eat bland foods, such as white rice, toast, or baked potato, until your diarrhea starts to get better. Gradually reintroduce nutrient-rich foods as tolerated or as told by your health care provider. This includes: ? Well-cooked protein foods. ? Peeled, seeded, and soft-cooked fruits and vegetables. ? Low-fat dairy  products.  Take vitamin and mineral supplements as told by your health care provider. Preventing dehydration   Start by sipping water or a special solution to prevent dehydration (oral rehydration solution, ORS). Urine that is clear or pale yellow means that you are getting enough fluid.  Try to drink at least 8-10 cups of fluid each day to help replace lost fluids.  You may add other liquids in addition to water, such as clear juice or decaffeinated sports drinks, as tolerated or as told by your health care provider.  Avoid drinks with caffeine, such as coffee, tea, or soft drinks.  Avoid alcohol. What foods are recommended? The items listed may not be a complete list. Talk with your health care provider about what dietary choices are best for you. Grains White rice. White, [place], or pita breads (fresh or toasted), including plain rolls, buns, or bagels. White pasta. Saltine, soda, or graham crackers. Pretzels. Low-fiber cereal. Cooked cereals made with water (such as cornmeal, farina, or cream cereals). Plain muffins. Matzo. Melba toast. Zwieback. Vegetables Potatoes (without the skin). Most well-cooked and canned vegetables without skins or seeds. Tender lettuce. Fruits Apple sauce. Fruits canned in juice. Cooked apricots, cherries, grapefruit, peaches, pears, or plums. Fresh bananas and cantaloupe. Meats and other protein foods Baked or boiled chicken. Eggs. Tofu. Fish. Seafood. Smooth nut butters. Ground or well-cooked tender beef, ham, veal, lamb, pork, or poultry. Dairy Plain yogurt, kefir, and unsweetened liquid yogurt. Lactose-free milk, buttermilk, skim milk, or soy milk. Low-fat or nonfat hard cheese. Beverages Water. Low-calorie sports drinks. Fruit juices without pulp. Strained tomato and  vegetable juices. Decaffeinated teas. Sugar-free beverages not sweetened with sugar alcohols. Oral rehydration solutions, if approved by your health care provider. Seasoning and other  foods Bouillon, broth, or soups made from recommended foods. What foods are not recommended? The items listed may not be a complete list. Talk with your health care provider about what dietary choices are best for you. Grains Whole grain, whole wheat, bran, or rye breads, rolls, pastas, and crackers. Wild or brown rice. Whole grain or bran cereals. Barley. Oats and oatmeal. Corn tortillas or taco shells. Granola. Popcorn. Vegetables Raw vegetables. Fried vegetables. Cabbage, broccoli, Brussels sprouts, artichokes, baked beans, beet greens, corn, kale, legumes, peas, sweet potatoes, and yams. Potato skins. Cooked spinach and cabbage. Fruits Dried fruit, including raisins and dates. Raw fruits. Stewed or dried prunes. Canned fruits with syrup. Meat and other protein foods Fried or fatty meats. Deli meats. Chunky nut butters. Nuts and seeds. Beans and lentils. Berniece Salines. Hot dogs. Sausage. Dairy High-fat cheeses. Whole milk, chocolate milk, and beverages made with milk, such as milk shakes. Half-and-half. Cream. sour cream. Ice cream. Beverages Caffeinated beverages (such as coffee, tea, soda, or energy drinks). Alcoholic beverages. Fruit juices with pulp. Prune juice. Soft drinks sweetened with high-fructose corn syrup or sugar alcohols. High-calorie sports drinks. Fats and oils Butter. Cream sauces. Margarine. Salad oils. Plain salad dressings. Olives. Avocados. Mayonnaise. Sweets and desserts Sweet rolls, doughnuts, and sweet breads. Sugar-free desserts sweetened with sugar alcohols such as xylitol and sorbitol. Seasoning and other foods Honey. Hot sauce. Chili powder. Gravy. Cream-based or milk-based soups. Pancakes and waffles. Summary  When you have diarrhea, the foods you eat and your eating habits are very important.  Make sure you get at least 8-10 cups of fluid each day, or enough to keep your urine clear or pale yellow.  Eat bland foods and gradually reintroduce healthy, nutrient-rich  foods as tolerated, or as told by your health care provider.  Avoid high-fiber, fried, greasy, or spicy foods. This information is not intended to replace advice given to you by your health care provider. Make sure you discuss any questions you have with your health care provider. Document Released: 04/18/2003 Document Revised: 01/24/2016 Document Reviewed: 01/24/2016 Elsevier Interactive Patient Education  2018 [employer].  Diarrhea, Adult Diarrhea is frequent loose and watery bowel movements. Diarrhea can make you feel weak and cause you to become dehydrated. Dehydration can make you tired and thirsty, cause you to have a dry mouth, and decrease how often you urinate. Diarrhea typically lasts 2-3 days. However, it can last longer if it is a sign of something more serious. It is important to treat your diarrhea as told by your health care provider. Follow these instructions at home: Eating and drinking  Follow these recommendations as told by your health care provider:  Take an oral rehydration solution (ORS). This is a drink that is sold at pharmacies and retail stores.  Drink clear fluids, such as water, ice chips, diluted fruit juice, and low-calorie sports drinks.  Eat bland, easy-to-digest foods in small amounts as you are able. These foods include bananas, applesauce, rice, lean meats, toast, and crackers.  Avoid drinking fluids that contain a lot of sugar or caffeine, such as energy drinks, sports drinks, and soda.  Avoid alcohol.  Avoid spicy or fatty foods.  General instructions  Drink enough fluid to keep your urine clear or pale yellow.  Wash your hands often. If soap and water are not available, use hand sanitizer.  Make sure  that all people in your household wash their hands well and often.  Take over-the-counter and prescription medicines only as told by your health care provider.  Rest at home while you recover.  Watch your condition for any changes.  Take a  warm bath to relieve any burning or pain from frequent diarrhea episodes.  Keep all follow-up visits as told by your health care provider. This is important. Contact a health care provider if:  You have a fever.  Your diarrhea gets worse.  You have new symptoms.  You cannot keep fluids down.  You feel light-headed or dizzy.  You have a headache  You have muscle cramps. Get help right away if:  You have chest pain.  You feel extremely weak or you faint.  You have bloody or black stools or stools that look like tar.  You have severe pain, cramping, or bloating in your abdomen.  You have trouble breathing or you are breathing very quickly.  Your heart is beating very quickly.  Your skin feels cold and clammy.  You feel confused.  You have signs of dehydration, such as: ? Dark urine, very little urine, or no urine. ? Cracked lips. ? Dry mouth. ? Sunken eyes. ? Sleepiness. ? Weakness. This information is not intended to replace advice given to you by your health care provider. Make sure you discuss any questions you have with your health care provider. Document Released: 01/16/2002 Document Revised: 06/06/2015 Document Reviewed: 10/02/2014 Elsevier Interactive Patient Education  [employer].

## 2018-01-25 ENCOUNTER — Ambulatory Visit (INDEPENDENT_AMBULATORY_CARE_PROVIDER_SITE_OTHER): Payer: 59 | Admitting: Family Medicine

## 2018-01-25 ENCOUNTER — Encounter: Payer: Self-pay | Admitting: Family Medicine

## 2018-01-25 VITALS — BP 120/78 | HR 70 | Temp 98.0°F | Wt 179.0 lb

## 2018-01-25 DIAGNOSIS — M25511 Pain in right shoulder: Secondary | ICD-10-CM

## 2018-01-25 NOTE — Progress Notes (Signed)
   Subjective:    Patient ID: Catherine Hill, female    DOB: 30-Jul-1985, 32 y.o.   MRN: [id number]  HPI She is here for evaluation of right shoulder pain.  This started approximately a week ago.  No history of injury or overuse.  No other joints are involved.  She has had acupuncture on it with minimal relief.  She is in the process of having IVF.   Review of Systems     Objective:   Physical Exam Alert and in no distress.  No palpable tenderness to the joint.  Limited range of motion due to pain.  Full evaluation could not be done because of the pain.       Assessment & Plan:  Acute pain of right shoulder I explained that I would like to get an x-ray as well as possibly give an injection into the shoulder to see if this would help as at this point very difficult to get a good evaluation.  She is reluctant because of the IVF therapy.  Recommend she discuss this with her obstetrician in terms of steroid injection into the shoulder and possible x-ray.  Also recommend Tylenol and heat for pain.  She will keep me informed as to what her next step will be.

## 2018-03-12 HISTORY — PX: LAPAROSCOPY: SHX197

## 2018-12-20 ENCOUNTER — Other Ambulatory Visit: Payer: Self-pay

## 2018-12-20 DIAGNOSIS — Z20822 Contact with and (suspected) exposure to covid-19: Secondary | ICD-10-CM

## 2018-12-23 LAB — NOVEL CORONAVIRUS, NAA: SARS-CoV-2, NAA: NOT DETECTED

## 2019-11-15 LAB — OB RESULTS CONSOLE HEPATITIS B SURFACE ANTIGEN: Hepatitis B Surface Ag: NEGATIVE

## 2019-11-15 LAB — OB RESULTS CONSOLE RUBELLA ANTIBODY, IGM: Rubella: IMMUNE

## 2019-11-15 LAB — HM PAP SMEAR: HM Pap smear: NORMAL

## 2019-11-15 LAB — OB RESULTS CONSOLE ABO/RH: RH Type: NEGATIVE

## 2019-11-15 LAB — OB RESULTS CONSOLE HIV ANTIBODY (ROUTINE TESTING): HIV: NONREACTIVE

## 2019-11-15 LAB — RESULTS CONSOLE HPV: CHL HPV: NEGATIVE

## 2019-11-15 LAB — OB RESULTS CONSOLE ANTIBODY SCREEN: Antibody Screen: NEGATIVE

## 2019-11-15 LAB — OB RESULTS CONSOLE RPR: RPR: NONREACTIVE

## 2020-04-11 ENCOUNTER — Other Ambulatory Visit: Payer: Self-pay

## 2020-04-11 ENCOUNTER — Encounter (HOSPITAL_COMMUNITY): Payer: Self-pay | Admitting: Obstetrics and Gynecology

## 2020-04-11 ENCOUNTER — Inpatient Hospital Stay (HOSPITAL_COMMUNITY)
Admission: AD | Admit: 2020-04-11 | Discharge: 2020-04-11 | Disposition: A | Discharge status: Home / Self Care | Payer: 59 | Attending: Obstetrics and Gynecology | Admitting: Obstetrics and Gynecology

## 2020-04-11 DIAGNOSIS — K529 Noninfective gastroenteritis and colitis, unspecified: Secondary | ICD-10-CM | POA: Diagnosis not present

## 2020-04-11 DIAGNOSIS — Z3A31 31 weeks gestation of pregnancy: Secondary | ICD-10-CM | POA: Diagnosis not present

## 2020-04-11 DIAGNOSIS — Z3689 Encounter for other specified antenatal screening: Secondary | ICD-10-CM

## 2020-04-11 DIAGNOSIS — R197 Diarrhea, unspecified: Secondary | ICD-10-CM | POA: Diagnosis present

## 2020-04-11 DIAGNOSIS — Z79899 Other long term (current) drug therapy: Secondary | ICD-10-CM | POA: Diagnosis not present

## 2020-04-11 DIAGNOSIS — O99613 Diseases of the digestive system complicating pregnancy, third trimester: Secondary | ICD-10-CM | POA: Insufficient documentation

## 2020-04-11 DIAGNOSIS — O09813 Supervision of pregnancy resulting from assisted reproductive technology, third trimester: Secondary | ICD-10-CM | POA: Diagnosis not present

## 2020-04-11 DIAGNOSIS — O09523 Supervision of elderly multigravida, third trimester: Secondary | ICD-10-CM | POA: Insufficient documentation

## 2020-04-11 HISTORY — DX: Endometriosis, unspecified: N80.9

## 2020-04-11 HISTORY — DX: Female infertility, unspecified: N97.9

## 2020-04-11 LAB — BASIC METABOLIC PANEL
Anion gap: 9 (ref 5–15)
BUN: 12 mg/dL (ref 6–20)
CO2: 19 mmol/L — ABNORMAL LOW (ref 22–32)
Calcium: 8.6 mg/dL — ABNORMAL LOW (ref 8.9–10.3)
Chloride: 105 mmol/L (ref 98–111)
Creatinine, Ser: 0.64 mg/dL (ref 0.44–1.00)
GFR, Estimated: >60 mL/min (ref >60)
Glucose, Bld: 94 mg/dL (ref 70–99)
Potassium: 3.7 mmol/L (ref 3.5–5.1)
Sodium: 133 mmol/L — ABNORMAL LOW (ref 135–145)

## 2020-04-11 LAB — URINALYSIS, ROUTINE W REFLEX MICROSCOPIC
Bilirubin Urine: NEGATIVE
Glucose, UA: NEGATIVE mg/dL
Hgb urine dipstick: NEGATIVE
Ketones, ur: NEGATIVE mg/dL
Leukocytes,Ua: NEGATIVE
Nitrite: NEGATIVE
Protein, ur: NEGATIVE mg/dL
Specific Gravity, Urine: 1.001 — ABNORMAL LOW (ref 1.005–1.030)
pH: 8 (ref 5.0–8.0)

## 2020-04-11 LAB — CBC
HCT: 34.1 % — ABNORMAL LOW (ref 36.0–46.0)
Hemoglobin: 11.5 g/dL — ABNORMAL LOW (ref 12.0–15.0)
MCH: 31.8 pg (ref 26.0–34.0)
MCHC: 33.7 g/dL (ref 30.0–36.0)
MCV: 94.2 fL (ref 80.0–100.0)
Platelets: 234 10*3/uL (ref 150–400)
RBC: 3.62 MIL/uL — ABNORMAL LOW (ref 3.87–5.11)
RDW: 11.9 % (ref 11.5–15.5)
WBC: 10.2 10*3/uL (ref 4.0–10.5)
nRBC: 0 % (ref 0.0–0.2)

## 2020-04-11 MED ORDER — ONDANSETRON 4 MG PO TBDP
4.0000 mg | ORAL_TABLET | Freq: Once | ORAL | Status: DC
  Filled 2020-04-11: qty 1

## 2020-04-11 MED ORDER — ONDANSETRON 4 MG PO TBDP: 4.0000 mg | ORAL_TABLET | Freq: Three times a day (TID) | ORAL | 0 refills | Status: DC | PRN

## 2020-04-11 NOTE — MAU Provider Note (Signed)
History     CSN: [id number]  Arrival date and time: 04/11/20 1742   Chief Complaint  Patient presents with  . Leg Swelling   34 y.o. G2P0010 @31 .0 wks presenting with nausea, diarrhea, and leg swelling. Reports not feeling well today when she woke. She did a short workout then felt faint. Had several episodes of loose stools. Having nausea but able to tolerate po. Reports good hydration today. Also reports decreased FM over the last 3 hrs. Denies sick contacts or recent eating out of the home. No fevers. Reports swelling in her left leg and ankle which she states has improved. Also reports a brown stain on her underwear this am, unsure if from hemorrhoid. No VB or LOF since.  OB History    Gravida  2   Para  0   Term  0   Preterm  0   AB  1   Living  0     SAB  1   IAB  0   Ectopic  0   Multiple  0   Live Births              Past Medical History:  Diagnosis Date  . Allergy    seasonal  . Dysmenorrhea   . Endometriosis   . Fibrocystic breast disease   . Infertility, female   . Mononucleosis 02/2011  . Ovarian cyst 02/2011    Past Surgical History:  Procedure Laterality Date  . LAPAROSCOPY  03/07/2011   Procedure: LAPAROSCOPY OPERATIVE;  Surgeon: 03/09/2011, MD;  Location: [facility];  Service: Gynecology;  Laterality: Left;  Diagnostic laparoscopy and drainage of left ovarian cyst. Repair of cervical laceration.  . WISDOM TOOTH EXTRACTION      Family History  Problem Relation Age of Onset  . Thyroid disease Mother   . Hyperlipidemia Mother   . Hyperlipidemia Father   . Vision loss Father        blind in L eye (?CVA?)  . Cancer Father        skin  . Hyperlipidemia Sister   . Cancer Maternal Aunt        lung (smoker)  . Diabetes Maternal Uncle   . Cancer Paternal Grandfather        bladder cancer    Social History   Tobacco Use  . Smoking status: Never Smoker  . Smokeless tobacco: Never Used  Substance Use Topics  . Alcohol use: Yes     Alcohol/week: 0.0 standard drinks    Comment: 2-3 drinks/month  . Drug use: No    Allergies: No Known Allergies  Medications Prior to Admission  Medication Sig Dispense Refill Last Dose  . cetirizine (ZYRTEC) 10 MG tablet Take 10 mg by mouth daily.     Robley Fries EST ESTROGENS-METHYLTEST PO Take by mouth.     . estradiol (VIVELLE-DOT) 0.025 MG/24HR Place 1 patch onto the skin 2 (two) times a week.     . fluticasone (FLONASE) 50 MCG/ACT nasal spray Place 2 sprays into both nostrils daily.  1   . ibuprofen (ADVIL,MOTRIN) 200 MG tablet Take 400 mg by mouth every 6 (six) hours as needed.     Marland Kitchen letrozole (FEMARA) 2.5 MG tablet Take by mouth.     . Multiple Vitamin (MULTIVITAMIN) tablet Take 1 tablet by mouth daily.     . progesterone (PROMETRIUM) 100 MG capsule Take 100 capsules by mouth once.       Review of Systems  Constitutional: Negative for chills  and fever.  Respiratory: Negative for shortness of breath.   Cardiovascular: Positive for leg swelling. Negative for chest pain.  Gastrointestinal: Positive for diarrhea and nausea. Negative for abdominal pain and vomiting.  Neurological: Positive for dizziness and light-headedness.   Physical Exam   Blood pressure 121/66, pulse 77, temperature 98.1 F (36.7 C), resp. rate 18, weight 97.2 kg.  Physical Exam Vitals and nursing note reviewed.  Constitutional:      General: She is not in acute distress.    Appearance: Normal appearance.  HENT:     Head: Normocephalic and atraumatic.  Cardiovascular:     Pulses: Normal pulses.  Pulmonary:     Effort: Pulmonary effort is normal. No respiratory distress.  Abdominal:     Palpations: Abdomen is soft.     Tenderness: There is no abdominal tenderness.     Comments: gravid  Musculoskeletal:        General: No swelling or deformity. Normal range of motion.     Cervical back: Normal range of motion.     Right lower leg: Normal.     Left lower leg: Normal.     Right ankle: Normal.     Left  ankle: Normal.     Right foot: Normal.     Left foot: Normal.     Comments: Left calf 37cm Right calf 39 cm Homans neg  Skin:    General: Skin is warm and dry.  Neurological:     General: No focal deficit present.     Mental Status: She is alert and oriented to person, place, and time.  Psychiatric:        Mood and Affect: Mood normal.        Behavior: Behavior normal.   EFM: 145 bpm, mod variability, + accels, no decels Toco: none  Results for orders placed or performed during the hospital encounter of 04/11/20 (from the past 24 hour(s))  Urinalysis, Routine w reflex microscopic Urine, Clean Catch     Status: Abnormal   Collection Time: 04/11/20  6:13 PM  Result Value Ref Range   Color, Urine COLORLESS (A) YELLOW   APPearance CLEAR CLEAR   Specific Gravity, Urine 1.001 (L) 1.005 - 1.030   pH 8.0 5.0 - 8.0   Glucose, UA NEGATIVE NEGATIVE mg/dL   Hgb urine dipstick NEGATIVE NEGATIVE   Bilirubin Urine NEGATIVE NEGATIVE   Ketones, ur NEGATIVE NEGATIVE mg/dL   Protein, ur NEGATIVE NEGATIVE mg/dL   Nitrite NEGATIVE NEGATIVE   Leukocytes,Ua NEGATIVE NEGATIVE  Basic metabolic panel     Status: Abnormal   Collection Time: 04/11/20  7:19 PM  Result Value Ref Range   Sodium 133 (L) 135 - 145 mmol/L   Potassium 3.7 3.5 - 5.1 mmol/L   Chloride 105 98 - 111 mmol/L   CO2 19 (L) 22 - 32 mmol/L   Glucose, Bld 94 70 - 99 mg/dL   BUN 12 6 - 20 mg/dL   Creatinine, Ser 7.82 0.44 - 1.00 mg/dL   Calcium 8.6 (L) 8.9 - 10.3 mg/dL   GFR, Estimated >95 >62 mL/min   Anion gap 9 5 - 15  CBC     Status: Abnormal   Collection Time: 04/11/20  7:19 PM  Result Value Ref Range   WBC 10.2 4.0 - 10.5 K/uL   RBC 3.62 (L) 3.87 - 5.11 MIL/uL   Hemoglobin 11.5 (L) 12.0 - 15.0 g/dL   HCT 13.0 (L) 86.5 - 78.4 %   MCV 94.2 80.0 - 100.0  fL   MCH 31.8 26.0 - 34.0 pg   MCHC 33.7 30.0 - 36.0 g/dL   RDW 51.8 84.1 - 66.0 %   Platelets 234 150 - 400 K/uL   nRBC 0.0 0.0 - 0.2 %   MAU Course   Procedures  MDM Review of prenatal records: IVF pregnancy. Labs ordered and reviewed. No signs of dehydration. Declines antiemetic. No signs of DVT. Reports increased FM, marked more than 10 times on EFM, and NST reactive. Likely GI process and self limiting, discussed supportive measures. Stable for discharge home.  Assessment and Plan   1. [redacted] weeks gestation of pregnancy   2. NST (non-stress test) reactive   3. Gastroenteritis    Discharge home Follow up at [facility] as scheduled Rx Zofran Maintain hydration  Allergies as of 04/11/2020   No Known Allergies     Medication List    STOP taking these medications   EST ESTROGENS-METHYLTEST PO   estradiol 0.025 MG/24HR Commonly known as: VIVELLE-DOT   ibuprofen 200 MG tablet Commonly known as: ADVIL   letrozole 2.5 MG tablet Commonly known as: FEMARA   progesterone 100 MG capsule Commonly known as: PROMETRIUM     TAKE these medications   cetirizine 10 MG tablet Commonly known as: ZYRTEC Take 10 mg by mouth daily.   fluticasone 50 MCG/ACT nasal spray Commonly known as: FLONASE Place 2 sprays into both nostrils daily.   multivitamin tablet Take 1 tablet by mouth daily.   ondansetron 4 MG disintegrating tablet Commonly known as: ZOFRAN-ODT Take 1 tablet (4 mg total) by mouth every 8 (eight) hours as needed for nausea or vomiting.      Donette Larry, CNM 04/11/2020, 8:09 PM

## 2020-04-11 NOTE — MAU Note (Signed)
Patient woke up feeling lightheaded/faint.  Noticed some spotting in her underwear.  This afternoon she noticed swelling only in her left leg/ankle.  Also reports diarrhea & nausea.  Took a COVID test and it was negative.  States she wasn't having any symptoms before today.  Denies sick contacts.  Hasn't felt baby move in 2-3 hours (FHR 140s).

## 2020-04-11 NOTE — Discharge Instructions (Signed)

## 2020-05-21 ENCOUNTER — Other Ambulatory Visit: Payer: Self-pay | Admitting: Obstetrics

## 2020-05-23 ENCOUNTER — Encounter (HOSPITAL_COMMUNITY): Payer: Self-pay

## 2020-05-23 NOTE — Patient Instructions (Signed)
BREEZY HERTENSTEIN  05/23/2020   Your procedure is scheduled on:  06/06/2020  Arrive at 0530 at Entrance C on [address] at [facility]  and [address]. You are invited to use the FREE valet parking or use the Visitor's parking deck.  Pick up the phone at the desk and dial [phone].  Call this number if you have problems the morning of surgery: [phone]  Remember:   Do not eat food:(After Midnight) Desps de medianoche.  Do not drink clear liquids: (After Midnight) Desps de medianoche.  Take these medicines the morning of surgery with A SIP OF WATER:  none   Do not wear jewelry, make-up or nail polish.  Do not wear lotions, powders, or perfumes. Do not wear deodorant.  Do not shave 48 hours prior to surgery.  Do not bring valuables to the hospital.  [facility] is not   responsible for any belongings or valuables brought to the hospital.  Contacts, dentures or bridgework may not be worn into surgery.  Leave suitcase in the car. After surgery it may be brought to your room.  For patients admitted to the hospital, checkout time is 11:00 AM the day of              discharge.      Please read over the following fact sheets that you were given:     Preparing for Surgery

## 2020-05-24 ENCOUNTER — Inpatient Hospital Stay (HOSPITAL_COMMUNITY)
Admission: AD | Admit: 2020-05-24 | Discharge: 2020-05-24 | Disposition: A | Discharge status: Home / Self Care | Payer: 59 | Attending: Obstetrics and Gynecology | Admitting: Obstetrics and Gynecology

## 2020-05-24 ENCOUNTER — Other Ambulatory Visit: Payer: Self-pay

## 2020-05-24 ENCOUNTER — Inpatient Hospital Stay (HOSPITAL_BASED_OUTPATIENT_CLINIC_OR_DEPARTMENT_OTHER): Payer: 59

## 2020-05-24 ENCOUNTER — Encounter (HOSPITAL_COMMUNITY): Payer: Self-pay | Admitting: Obstetrics and Gynecology

## 2020-05-24 DIAGNOSIS — O368130 Decreased fetal movements, third trimester, not applicable or unspecified: Secondary | ICD-10-CM

## 2020-05-24 DIAGNOSIS — O10013 Pre-existing essential hypertension complicating pregnancy, third trimester: Secondary | ICD-10-CM

## 2020-05-24 DIAGNOSIS — Z3A37 37 weeks gestation of pregnancy: Secondary | ICD-10-CM | POA: Diagnosis not present

## 2020-05-24 DIAGNOSIS — O3663X0 Maternal care for excessive fetal growth, third trimester, not applicable or unspecified: Secondary | ICD-10-CM | POA: Diagnosis not present

## 2020-05-24 DIAGNOSIS — O09523 Supervision of elderly multigravida, third trimester: Secondary | ICD-10-CM | POA: Insufficient documentation

## 2020-05-24 DIAGNOSIS — O26893 Other specified pregnancy related conditions, third trimester: Secondary | ICD-10-CM

## 2020-05-24 DIAGNOSIS — Z3689 Encounter for other specified antenatal screening: Secondary | ICD-10-CM | POA: Diagnosis present

## 2020-05-24 DIAGNOSIS — R109 Unspecified abdominal pain: Secondary | ICD-10-CM

## 2020-05-24 HISTORY — DX: Unspecified abnormal cytological findings in specimens from vagina: R87.629

## 2020-05-24 LAB — URINALYSIS, ROUTINE W REFLEX MICROSCOPIC
Bilirubin Urine: NEGATIVE
Glucose, UA: NEGATIVE mg/dL
Hgb urine dipstick: NEGATIVE
Ketones, ur: NEGATIVE mg/dL
Leukocytes,Ua: NEGATIVE
Nitrite: NEGATIVE
Protein, ur: NEGATIVE mg/dL
Specific Gravity, Urine: 1.002 — ABNORMAL LOW (ref 1.005–1.030)
pH: 7 (ref 5.0–8.0)

## 2020-05-24 NOTE — MAU Note (Signed)
Baby not moving as much since last night.  Last night was having really bad sharp pains, this morning they are irregular and not as bad.  Denies bleeding or leaking.  Last night BP spiked up, today it was back down to normal.  Tried to call office, emergency line not picking up- so just came in.

## 2020-05-24 NOTE — MAU Provider Note (Signed)
History     CSN: [id number]  Arrival date and time: 05/24/20 1133   Event Date/Time   First Provider Initiated Contact with Patient 05/24/20 1210      Chief Complaint  Patient presents with  . Abdominal Pain  . Hypertension  . Decreased Fetal Movement   HPI Catherine Hill is a 35 y.o. G2P0010 at [redacted]w[redacted]d who presents to MAU with chief complaints of abdominal cramping, decreased fetal movement and new onset HTN x 1 on her home cuff.   DFM Patient states she usually wakes up to very active fetal movement around 0400. She did not experience that today and was only able to trigger very minor movement once or twice since then. She denies detecting fetal movement on arrival to MAU.   Elevated BP Patient endorses BP on here home cuff with systolic reading in the 150s. She denies history of HTN. She denies headache, visual disturbances, RUQ/epigastric pain, new onset swelling or weight gain.  Abdominal Cramping This is a new problem, resolved on arrival to MAU. Patient endorses "sharp" lower abdominal pain last night. Pain was irregular this morning then resolved without intervention. She denies vaginal bleeding, leaking of fluid, fever, falls, or recent illness.   Patient is planning primary cesarean for LGA fetus. Patient receives care with [facility] and her next appointment is Tuesday 05/28/2020.  OB History    Gravida  2   Para  0   Term  0   Preterm  0   AB  1   Living  0     SAB  1   IAB  0   Ectopic  0   Multiple  0   Live Births              Past Medical History:  Diagnosis Date  . Allergy    seasonal  . Dysmenorrhea   . Endometriosis   . Fibrocystic breast disease   . Infertility, female   . Mononucleosis 02/2011  . Ovarian cyst 02/2011  . Vaginal Pap smear, abnormal    age 20, rpt normal    Past Surgical History:  Procedure Laterality Date  . LAPAROSCOPY  03/07/2011   Procedure: LAPAROSCOPY OPERATIVE;  Surgeon: Robley Fries, MD;   Location: [facility];  Service: Gynecology;  Laterality: Left;  Diagnostic laparoscopy and drainage of left ovarian cyst. Repair of cervical laceration.  . WISDOM TOOTH EXTRACTION      Family History  Problem Relation Age of Onset  . Thyroid disease Mother   . Hyperlipidemia Mother   . Hyperlipidemia Father   . Vision loss Father        blind in L eye (?CVA?)  . Cancer Father        skin  . Hyperlipidemia Sister   . Cancer Maternal Aunt        lung (smoker)  . Diabetes Maternal Uncle   . Cancer Paternal Grandfather        bladder cancer    Social History   Tobacco Use  . Smoking status: Never Smoker  . Smokeless tobacco: Never Used  Vaping Use  . Vaping Use: Never used  Substance Use Topics  . Alcohol use: Not Currently    Alcohol/week: 0.0 standard drinks    Comment: 2-3 drinks/month  . Drug use: No    Allergies: No Known Allergies  Medications Prior to Admission  Medication Sig Dispense Refill Last Dose  . cetirizine (ZYRTEC) 10 MG tablet Take 10 mg by mouth  daily.   05/23/2020 at Unknown time  . Multiple Vitamin (MULTIVITAMIN) tablet Take 1 tablet by mouth daily.   05/23/2020 at Unknown time  . fluticasone (FLONASE) 50 MCG/ACT nasal spray Place 2 sprays into both nostrils daily.  1   . ondansetron (ZOFRAN-ODT) 4 MG disintegrating tablet Take 1 tablet (4 mg total) by mouth every 8 (eight) hours as needed for nausea or vomiting. 20 tablet 0     Review of Systems  Gastrointestinal: Positive for abdominal pain.  All other systems reviewed and are negative.  Physical Exam   Blood pressure 116/69, pulse 79, temperature 98.6 F (37 C), temperature source Oral, resp. rate 18, height 5\' 5"  (1.651 m), weight 105.9 kg, SpO2 99 %.  Physical Exam Vitals and nursing note reviewed. Exam conducted with a chaperone present.  Constitutional:      General: She is not in acute distress.    Appearance: She is well-developed. She is not ill-appearing.  Cardiovascular:     Rate and  Rhythm: Normal rate.     Heart sounds: Normal heart sounds.  Pulmonary:     Effort: Pulmonary effort is normal.     Breath sounds: Normal breath sounds.  Abdominal:     Tenderness: There is no abdominal tenderness.  Skin:    Capillary Refill: Capillary refill takes less than 2 seconds.  Neurological:     Mental Status: She is alert and oriented to person, place, and time.  Psychiatric:        Mood and Affect: Mood normal.        Behavior: Behavior normal.     MAU Course  Procedures  --Reactive tracing x 1 hour prolonged monitoring --Toco: quiet --Baby audibly moving against monitor in room, patient unable to detect fetal movement --Given NST button. Patient pushed button 3 times total. BPP ordered --BPP 8/8  Patient Vitals for the past 24 hrs:  BP Temp Temp src Pulse Resp SpO2 Height Weight  05/24/20 1301 111/72 -- -- 82 -- -- -- --  05/24/20 1246 113/67 -- -- 77 -- -- -- --  05/24/20 1235 -- -- -- -- -- 99 % -- --  05/24/20 1231 112/78 -- -- 80 -- -- -- --  05/24/20 1216 116/76 -- -- 81 -- -- -- --  05/24/20 1205 116/69 98.6 F (37 C) Oral 79 18 99 % -- --  05/24/20 1146 -- -- -- -- -- -- 5\' 5"  (1.651 m) 105.9 kg   Results for orders placed or performed during the hospital encounter of 05/24/20 (from the past 24 hour(s))  Urinalysis, Routine w reflex microscopic Urine, Clean Catch     Status: Abnormal   Collection Time: 05/24/20 11:49 AM  Result Value Ref Range   Color, Urine COLORLESS (A) YELLOW   APPearance CLEAR CLEAR   Specific Gravity, Urine 1.002 (L) 1.005 - 1.030   pH 7.0 5.0 - 8.0   Glucose, UA NEGATIVE NEGATIVE mg/dL   Hgb urine dipstick NEGATIVE NEGATIVE   Bilirubin Urine NEGATIVE NEGATIVE   Ketones, ur NEGATIVE NEGATIVE mg/dL   Protein, ur NEGATIVE NEGATIVE mg/dL   Nitrite NEGATIVE NEGATIVE   Leukocytes,Ua NEGATIVE NEGATIVE    Assessment and Plan  --35 y.o. G2P0010 at [redacted]w[redacted]d  --Normotensive x 1 hour --Reactive tracing x 1 hour --BPP 8/8 --Closed  cervix --Discharge home in stable condition  F/U: --Next appt with [facility] is 05/28/2020  [redacted]w[redacted]d, CNM 05/24/2020, 5:55 PM

## 2020-05-24 NOTE — Discharge Instructions (Signed)
Hypertension During Pregnancy Hypertension is also called high blood pressure. High blood pressure means that the force of the blood moving in your body is high enough to cause problems for you and your baby. Different types of high blood pressure can happen during pregnancy. The types are:  High blood pressure before you got pregnant. This is called chronic hypertension.  This can continue during your pregnancy. Your doctor will want to keep checking your blood pressure. You may need medicine to control your blood pressure while you are pregnant. You will need follow-up visits after you have your baby.  High blood pressure that goes up during pregnancy when it was normal before. This is called gestational hypertension. It will often get better after you have your baby, but your doctor will need to watch your blood pressure to make sure that it is getting better.  You may develop high blood pressure after giving birth. This is called postpartum hypertension. This often occurs within 48 hours after childbirth but may occur up to 6 weeks after giving birth. Very high blood pressure during pregnancy is an emergency that needs treatment right away. How does this affect me? If you have high blood pressure during pregnancy, you have a higher chance of developing high blood pressure:  As you get older.  If you get pregnant again. In some cases, high blood pressure during pregnancy can cause:  Stroke.  Heart attack.  Damage to the kidneys, lungs, or liver.  Preeclampsia.  HELLP syndrome.  Seizures.  Problems with the placenta. How does this affect my baby? Your baby may:  Be born early.  Not weigh as much as he or she should.  Not handle labor well, leading to a C-section. This condition may also result in a baby's death before birth (stillbirth). What are the risks?  Having high blood pressure during a past pregnancy.  Being overweight.  Being age 35 or older.  Being pregnant  for the first time.  Being pregnant with more than one baby.  Becoming pregnant using fertility methods, such as IVF.  Having other problems, such as diabetes or kidney disease. What can I do to lower my risk?  Keep a healthy weight.  Eat a healthy diet.  Follow what your doctor tells you about treating any medical problems that you had before you got pregnant. It is very important to go to all of your doctor visits. Your doctor will check your blood pressure and make sure that your pregnancy is progressing as it should. Treatment should start early if a problem is found.   How is this treated? Treatment for high blood pressure during pregnancy can vary. It depends on the type of high blood pressure you have and how serious it is.  If you were taking medicine for your blood pressure before you got pregnant, talk with your doctor. You may need to change the medicine during pregnancy if it is not safe for your baby.  If your blood pressure goes up during pregnancy, your doctor may order medicine to treat this.  If you are at risk for preeclampsia, your doctor may tell you to take a low-dose aspirin while you are pregnant.  If you have very high blood pressure, you may need to stay in the hospital so you and your baby can be watched closely. You may also need to take medicine to lower your blood pressure.  In some cases, if your condition gets worse, you may need to have your baby early.   Follow these instructions at home: Eating and drinking  Drink enough fluid to keep your pee (urine) pale yellow.  Avoid caffeine.   Lifestyle  Do not smoke or use any products that contain nicotine or tobacco. If you need help quitting, ask your doctor.  Do not use alcohol or drugs.  Avoid stress.  Rest and get plenty of sleep.  Regular exercise can help. Ask your doctor what kinds of exercise are best for you. General instructions  Take over-the-counter and prescription medicines only as  told by your doctor.  Keep all prenatal and follow-up visits. Contact a doctor if:  You have symptoms that your doctor told you to watch for, such as: ? Headaches. ? A feeling like you may vomit (nausea). ? Vomiting. ? Belly (abdominal) pain. ? Feeling dizzy or light-headed. Get help right away if:  You have symptoms of serious problems, such as: ? Very bad belly pain that does not get better with treatment. ? A very bad headache that does not get better. ? Blurry vision. ? Double vision. ? Vomiting that does not get better. ? Sudden, fast weight gain. ? Sudden swelling in your hands, ankles, or face. ? Bleeding from your vagina. ? Blood in your pee. ? Shortness of breath. ? Chest pain. ? Weakness on one side of your body. ? Trouble talking.  Your baby is not moving as much as usual. These symptoms may be an emergency. Get help right away. Call your local emergency services (911 in the [place]  Do not wait to see if the symptoms will go away.  Do not drive yourself to the hospital. Summary  High blood pressure is also called hypertension.  High blood pressure means that the force of the blood moving in your body is high enough to cause problems for you and your baby.  Get help right away if you have symptoms of serious problems due to high blood pressure.  Keep all prenatal and follow-up visits. This information is not intended to replace advice given to you by your health care provider. Make sure you discuss any questions you have with your health care provider. Document Revised: 10/19/2019 Document Reviewed: 10/19/2019 Elsevier Patient Education  2021 [employer].  

## 2020-05-25 DIAGNOSIS — O368130 Decreased fetal movements, third trimester, not applicable or unspecified: Secondary | ICD-10-CM

## 2020-05-25 DIAGNOSIS — Z3A37 37 weeks gestation of pregnancy: Secondary | ICD-10-CM | POA: Diagnosis not present

## 2020-06-04 ENCOUNTER — Other Ambulatory Visit (HOSPITAL_COMMUNITY)
Admission: RE | Admit: 2020-06-04 | Discharge: 2020-06-04 | Disposition: A | Discharge status: Home / Self Care | Payer: 59 | Source: Ambulatory Visit | Attending: Obstetrics | Admitting: Obstetrics

## 2020-06-04 ENCOUNTER — Other Ambulatory Visit: Payer: Self-pay

## 2020-06-04 ENCOUNTER — Encounter (HOSPITAL_COMMUNITY)
Admission: RE | Admit: 2020-06-04 | Discharge: 2020-06-04 | Disposition: A | Discharge status: Home / Self Care | Payer: 59 | Source: Ambulatory Visit | Attending: Obstetrics | Admitting: Obstetrics

## 2020-06-04 DIAGNOSIS — Z20822 Contact with and (suspected) exposure to covid-19: Secondary | ICD-10-CM | POA: Insufficient documentation

## 2020-06-04 DIAGNOSIS — Z01812 Encounter for preprocedural laboratory examination: Secondary | ICD-10-CM | POA: Insufficient documentation

## 2020-06-04 LAB — CBC
HCT: 34.9 % — ABNORMAL LOW (ref 36.0–46.0)
Hemoglobin: 11.9 g/dL — ABNORMAL LOW (ref 12.0–15.0)
MCH: 31.6 pg (ref 26.0–34.0)
MCHC: 34.1 g/dL (ref 30.0–36.0)
MCV: 92.8 fL (ref 80.0–100.0)
Platelets: 180 10*3/uL (ref 150–400)
RBC: 3.76 MIL/uL — ABNORMAL LOW (ref 3.87–5.11)
RDW: 11.9 % (ref 11.5–15.5)
WBC: 8.4 10*3/uL (ref 4.0–10.5)
nRBC: 0 % (ref 0.0–0.2)

## 2020-06-04 LAB — TYPE AND SCREEN
ABO/RH(D): A NEG
Antibody Screen: NEGATIVE

## 2020-06-05 LAB — RPR: RPR Ser Ql: NONREACTIVE

## 2020-06-05 LAB — SARS CORONAVIRUS 2 (TAT 6-24 HRS): SARS Coronavirus 2: NEGATIVE

## 2020-06-06 ENCOUNTER — Encounter (HOSPITAL_COMMUNITY): Payer: Self-pay | Admitting: Obstetrics

## 2020-06-06 ENCOUNTER — Inpatient Hospital Stay (HOSPITAL_COMMUNITY): Payer: 59 | Admitting: Anesthesiology

## 2020-06-06 ENCOUNTER — Inpatient Hospital Stay (HOSPITAL_COMMUNITY)
Admission: RE | Admit: 2020-06-06 | Discharge: 2020-06-08 | DRG: 788 | Disposition: A | Discharge status: Home / Self Care | Payer: 59 | Attending: Obstetrics | Admitting: Obstetrics

## 2020-06-06 ENCOUNTER — Other Ambulatory Visit: Payer: Self-pay

## 2020-06-06 ENCOUNTER — Encounter (HOSPITAL_COMMUNITY)
Admission: RE | Disposition: A | Discharge status: Home / Self Care | Payer: Self-pay | Source: Home / Self Care | Attending: Obstetrics

## 2020-06-06 DIAGNOSIS — O99824 Streptococcus B carrier state complicating childbirth: Secondary | ICD-10-CM | POA: Diagnosis present

## 2020-06-06 DIAGNOSIS — O3663X0 Maternal care for excessive fetal growth, third trimester, not applicable or unspecified: Secondary | ICD-10-CM | POA: Diagnosis present

## 2020-06-06 DIAGNOSIS — Z20822 Contact with and (suspected) exposure to covid-19: Secondary | ICD-10-CM | POA: Diagnosis present

## 2020-06-06 DIAGNOSIS — O43123 Velamentous insertion of umbilical cord, third trimester: Secondary | ICD-10-CM | POA: Diagnosis present

## 2020-06-06 DIAGNOSIS — O3660X0 Maternal care for excessive fetal growth, unspecified trimester, not applicable or unspecified: Secondary | ICD-10-CM | POA: Diagnosis present

## 2020-06-06 DIAGNOSIS — Z3A39 39 weeks gestation of pregnancy: Secondary | ICD-10-CM | POA: Diagnosis not present

## 2020-06-06 DIAGNOSIS — Z6791 Unspecified blood type, Rh negative: Secondary | ICD-10-CM | POA: Diagnosis present

## 2020-06-06 DIAGNOSIS — O403XX0 Polyhydramnios, third trimester, not applicable or unspecified: Secondary | ICD-10-CM | POA: Diagnosis present

## 2020-06-06 DIAGNOSIS — O9902 Anemia complicating childbirth: Secondary | ICD-10-CM | POA: Diagnosis present

## 2020-06-06 DIAGNOSIS — IMO0002 Reserved for concepts with insufficient information to code with codable children: Secondary | ICD-10-CM | POA: Diagnosis present

## 2020-06-06 DIAGNOSIS — Z98891 History of uterine scar from previous surgery: Secondary | ICD-10-CM

## 2020-06-06 SURGERY — Surgical Case
Anesthesia: Spinal

## 2020-06-06 MED ORDER — SOD CITRATE-CITRIC ACID 500-334 MG/5ML PO SOLN
30.0000 mL | Freq: Once | ORAL | Status: AC
Start: 2020-06-06 — End: 2020-06-06
  Administered 2020-06-06: 30 mL via ORAL

## 2020-06-06 MED ORDER — ACETAMINOPHEN 500 MG PO TABS
1000.0000 mg | ORAL_TABLET | Freq: Four times a day (QID) | ORAL | Status: AC
  Filled 2020-06-06 (×4): qty 2

## 2020-06-06 MED ORDER — POVIDONE-IODINE 10 % EX SWAB: 2.0000 "application " | Freq: Once | CUTANEOUS | Status: DC

## 2020-06-06 MED ORDER — OXYTOCIN-SODIUM CHLORIDE 30-0.9 UT/500ML-% IV SOLN
INTRAVENOUS | Status: DC | PRN
  Administered 2020-06-06 (×2): 30 [IU] via INTRAVENOUS

## 2020-06-06 MED ORDER — SIMETHICONE 80 MG PO CHEW
80.0000 mg | CHEWABLE_TABLET | Freq: Three times a day (TID) | ORAL | Status: DC
  Administered 2020-06-06 – 2020-06-07 (×5): 80 mg via ORAL
  Filled 2020-06-06 (×6): qty 1

## 2020-06-06 MED ORDER — ONDANSETRON HCL 4 MG/2ML IJ SOLN: 4.0000 mg | Freq: Three times a day (TID) | INTRAMUSCULAR | Status: DC | PRN

## 2020-06-06 MED ORDER — KETOROLAC TROMETHAMINE 30 MG/ML IJ SOLN
INTRAMUSCULAR | Status: AC
  Filled 2020-06-06: qty 1

## 2020-06-06 MED ORDER — SCOPOLAMINE 1 MG/3DAYS TD PT72
MEDICATED_PATCH | TRANSDERMAL | Status: AC
  Filled 2020-06-06: qty 1

## 2020-06-06 MED ORDER — DIPHENHYDRAMINE HCL 25 MG PO CAPS: 25.0000 mg | ORAL_CAPSULE | Freq: Four times a day (QID) | ORAL | Status: DC | PRN

## 2020-06-06 MED ORDER — ACETAMINOPHEN 500 MG PO TABS
1000.0000 mg | ORAL_TABLET | Freq: Four times a day (QID) | ORAL | Status: DC
  Administered 2020-06-06 – 2020-06-08 (×9): 1000 mg via ORAL
  Filled 2020-06-06 (×5): qty 2

## 2020-06-06 MED ORDER — CEFAZOLIN SODIUM-DEXTROSE 2-4 GM/100ML-% IV SOLN
2.0000 g | INTRAVENOUS | Status: AC
  Administered 2020-06-06: 2 g via INTRAVENOUS

## 2020-06-06 MED ORDER — CEFAZOLIN SODIUM-DEXTROSE 2-4 GM/100ML-% IV SOLN
INTRAVENOUS | Status: AC
  Filled 2020-06-06: qty 100

## 2020-06-06 MED ORDER — OXYCODONE HCL 5 MG PO TABS: 5.0000 mg | ORAL_TABLET | ORAL | Status: DC | PRN

## 2020-06-06 MED ORDER — LACTATED RINGERS IV SOLN: INTRAVENOUS | Status: DC

## 2020-06-06 MED ORDER — KETOROLAC TROMETHAMINE 30 MG/ML IJ SOLN
30.0000 mg | Freq: Four times a day (QID) | INTRAMUSCULAR | Status: AC | PRN
  Administered 2020-06-06: 30 mg via INTRAMUSCULAR

## 2020-06-06 MED ORDER — NALBUPHINE HCL 10 MG/ML IJ SOLN
5.0000 mg | Freq: Once | INTRAMUSCULAR | Status: DC | PRN
Start: 2020-06-06 — End: 2020-06-09

## 2020-06-06 MED ORDER — SODIUM CHLORIDE 0.9% FLUSH: 3.0000 mL | INTRAVENOUS | Status: DC | PRN

## 2020-06-06 MED ORDER — BUPIVACAINE IN DEXTROSE 0.75-8.25 % IT SOLN
INTRATHECAL | Status: DC | PRN
  Administered 2020-06-06: 1.6 mL via INTRATHECAL

## 2020-06-06 MED ORDER — FENTANYL CITRATE (PF) 100 MCG/2ML IJ SOLN
INTRAMUSCULAR | Status: AC
  Filled 2020-06-06: qty 2

## 2020-06-06 MED ORDER — MENTHOL 3 MG MT LOZG: 1.0000 | LOZENGE | OROMUCOSAL | Status: DC | PRN

## 2020-06-06 MED ORDER — OXYTOCIN-SODIUM CHLORIDE 30-0.9 UT/500ML-% IV SOLN
INTRAVENOUS | Status: AC
  Filled 2020-06-06: qty 500

## 2020-06-06 MED ORDER — SOD CITRATE-CITRIC ACID 500-334 MG/5ML PO SOLN
ORAL | Status: AC
  Filled 2020-06-06: qty 30

## 2020-06-06 MED ORDER — SENNOSIDES-DOCUSATE SODIUM 8.6-50 MG PO TABS
2.0000 | ORAL_TABLET | ORAL | Status: DC
  Administered 2020-06-06 – 2020-06-07 (×2): 2 via ORAL
  Filled 2020-06-06 (×3): qty 2

## 2020-06-06 MED ORDER — ZOLPIDEM TARTRATE 5 MG PO TABS
5.0000 mg | ORAL_TABLET | Freq: Every evening | ORAL | Status: DC | PRN
Start: 2020-06-06 — End: 2020-06-09

## 2020-06-06 MED ORDER — ORAL CARE MOUTH RINSE: 15.0000 mL | Freq: Once | OROMUCOSAL | Status: DC

## 2020-06-06 MED ORDER — CHLORHEXIDINE GLUCONATE 0.12 % MT SOLN: 15.0000 mL | Freq: Once | OROMUCOSAL | Status: DC

## 2020-06-06 MED ORDER — NALOXONE HCL 4 MG/10ML IJ SOLN
1.0000 ug/kg/h | INTRAVENOUS | Status: DC | PRN
  Filled 2020-06-06: qty 5

## 2020-06-06 MED ORDER — SIMETHICONE 80 MG PO CHEW
80.0000 mg | CHEWABLE_TABLET | ORAL | Status: DC | PRN
  Administered 2020-06-08: 80 mg via ORAL
  Filled 2020-06-06: qty 1

## 2020-06-06 MED ORDER — MORPHINE SULFATE (PF) 0.5 MG/ML IJ SOLN
INTRAMUSCULAR | Status: DC | PRN
  Administered 2020-06-06: 150 ug via INTRATHECAL

## 2020-06-06 MED ORDER — NALOXONE HCL 0.4 MG/ML IJ SOLN: 0.4000 mg | INTRAMUSCULAR | Status: DC | PRN

## 2020-06-06 MED ORDER — SCOPOLAMINE 1 MG/3DAYS TD PT72
1.0000 | MEDICATED_PATCH | Freq: Once | TRANSDERMAL | Status: DC
  Administered 2020-06-06: 1.5 mg via TRANSDERMAL

## 2020-06-06 MED ORDER — FENTANYL CITRATE (PF) 100 MCG/2ML IJ SOLN
INTRAMUSCULAR | Status: DC | PRN
  Administered 2020-06-06: 15 ug via INTRATHECAL

## 2020-06-06 MED ORDER — KETOROLAC TROMETHAMINE 30 MG/ML IJ SOLN
30.0000 mg | Freq: Four times a day (QID) | INTRAMUSCULAR | Status: AC | PRN
  Filled 2020-06-06: qty 1

## 2020-06-06 MED ORDER — ONDANSETRON HCL 4 MG/2ML IJ SOLN
INTRAMUSCULAR | Status: DC | PRN
  Administered 2020-06-06: 4 mg via INTRAVENOUS

## 2020-06-06 MED ORDER — PHENYLEPHRINE HCL-NACL 20-0.9 MG/250ML-% IV SOLN
INTRAVENOUS | Status: AC
  Filled 2020-06-06: qty 250

## 2020-06-06 MED ORDER — ONDANSETRON HCL 4 MG/2ML IJ SOLN
INTRAMUSCULAR | Status: AC
  Filled 2020-06-06: qty 2

## 2020-06-06 MED ORDER — DEXAMETHASONE SODIUM PHOSPHATE 4 MG/ML IJ SOLN
INTRAMUSCULAR | Status: DC | PRN
  Administered 2020-06-06: 10 mg via INTRAVENOUS

## 2020-06-06 MED ORDER — MORPHINE SULFATE (PF) 0.5 MG/ML IJ SOLN
INTRAMUSCULAR | Status: AC
  Filled 2020-06-06: qty 10

## 2020-06-06 MED ORDER — PHENYLEPHRINE HCL-NACL 20-0.9 MG/250ML-% IV SOLN
INTRAVENOUS | Status: DC | PRN
  Administered 2020-06-06: 60 ug/min via INTRAVENOUS

## 2020-06-06 MED ORDER — NALBUPHINE HCL 10 MG/ML IJ SOLN
5.0000 mg | INTRAMUSCULAR | Status: DC | PRN
Start: 2020-06-06 — End: 2020-06-09

## 2020-06-06 MED ORDER — STERILE WATER FOR IRRIGATION IR SOLN
Status: DC | PRN
  Administered 2020-06-06: 1

## 2020-06-06 MED ORDER — NALBUPHINE HCL 10 MG/ML IJ SOLN: 5.0000 mg | INTRAMUSCULAR | Status: DC | PRN

## 2020-06-06 MED ORDER — PRENATAL MULTIVITAMIN CH
1.0000 | ORAL_TABLET | Freq: Every day | ORAL | Status: DC
  Administered 2020-06-07 – 2020-06-08 (×2): 1 via ORAL
  Filled 2020-06-06 (×2): qty 1

## 2020-06-06 MED ORDER — IBUPROFEN 600 MG PO TABS
600.0000 mg | ORAL_TABLET | Freq: Four times a day (QID) | ORAL | Status: DC
  Administered 2020-06-07 – 2020-06-08 (×4): 600 mg via ORAL
  Filled 2020-06-06 (×4): qty 1

## 2020-06-06 MED ORDER — KETOROLAC TROMETHAMINE 30 MG/ML IJ SOLN
30.0000 mg | Freq: Four times a day (QID) | INTRAMUSCULAR | Status: AC
  Administered 2020-06-06 – 2020-06-07 (×4): 30 mg via INTRAVENOUS
  Filled 2020-06-06 (×3): qty 1

## 2020-06-06 MED ORDER — DIPHENHYDRAMINE HCL 50 MG/ML IJ SOLN: 12.5000 mg | INTRAMUSCULAR | Status: DC | PRN

## 2020-06-06 MED ORDER — DIPHENHYDRAMINE HCL 25 MG PO CAPS: 25.0000 mg | ORAL_CAPSULE | ORAL | Status: DC | PRN

## 2020-06-06 MED ORDER — OXYTOCIN-SODIUM CHLORIDE 30-0.9 UT/500ML-% IV SOLN: 2.5000 [IU]/h | INTRAVENOUS | Status: AC

## 2020-06-06 MED ORDER — NALBUPHINE HCL 10 MG/ML IJ SOLN: 5.0000 mg | Freq: Once | INTRAMUSCULAR | Status: DC | PRN

## 2020-06-06 MED ORDER — TETANUS-DIPHTH-ACELL PERTUSSIS 5-2.5-18.5 LF-MCG/0.5 IM SUSY: 0.5000 mL | PREFILLED_SYRINGE | Freq: Once | INTRAMUSCULAR | Status: DC

## 2020-06-06 MED ORDER — COCONUT OIL OIL: 1.0000 "application " | TOPICAL_OIL | Status: DC | PRN

## 2020-06-06 SURGICAL SUPPLY — 37 items
APL SKNCLS STERI-STRIP NONHPOA (GAUZE/BANDAGES/DRESSINGS) ×1
BENZOIN TINCTURE PRP APPL 2/3 (GAUZE/BANDAGES/DRESSINGS) ×1 IMPLANT
CHLORAPREP W/TINT 26ML (MISCELLANEOUS) ×2 IMPLANT
CLAMP CORD UMBIL (MISCELLANEOUS) IMPLANT
CLOTH BEACON ORANGE TIMEOUT ST (SAFETY) ×2 IMPLANT
DRSG OPSITE POSTOP 4X10 (GAUZE/BANDAGES/DRESSINGS) ×2 IMPLANT
ELECT REM PT RETURN 9FT ADLT (ELECTROSURGICAL) ×2
ELECTRODE REM PT RTRN 9FT ADLT (ELECTROSURGICAL) ×1 IMPLANT
EXTRACTOR VACUUM M CUP 4 TUBE (SUCTIONS) IMPLANT
GLOVE BIO SURGEON STRL SZ 6.5 (GLOVE) ×2 IMPLANT
GLOVE BIOGEL PI IND STRL 7.0 (GLOVE) ×2 IMPLANT
GLOVE BIOGEL PI INDICATOR 7.0 (GLOVE) ×2
GOWN STRL REUS W/TWL LRG LVL3 (GOWN DISPOSABLE) ×4 IMPLANT
HEMOSTAT ARISTA ABSORB 3G PWDR (HEMOSTASIS) ×1 IMPLANT
KIT ABG SYR 3ML LUER SLIP (SYRINGE) IMPLANT
NDL HYPO 25X5/8 SAFETYGLIDE (NEEDLE) IMPLANT
NEEDLE HYPO 22GX1.5 SAFETY (NEEDLE) IMPLANT
NEEDLE HYPO 25X5/8 SAFETYGLIDE (NEEDLE) IMPLANT
NS IRRIG 1000ML POUR BTL (IV SOLUTION) ×2 IMPLANT
PACK C SECTION WH (CUSTOM PROCEDURE TRAY) ×2 IMPLANT
PAD OB MATERNITY 4.3X12.25 (PERSONAL CARE ITEMS) ×2 IMPLANT
PENCIL SMOKE EVAC W/HOLSTER (ELECTROSURGICAL) ×2 IMPLANT
SPONGE LAP 18X18 RF (DISPOSABLE) ×1 IMPLANT
STRIP CLOSURE SKIN 1/2X4 (GAUZE/BANDAGES/DRESSINGS) ×1 IMPLANT
SUT MON AB 4-0 PS1 27 (SUTURE) ×2 IMPLANT
SUT PLAIN 0 NONE (SUTURE) IMPLANT
SUT PLAIN 2 0 XLH (SUTURE) ×2 IMPLANT
SUT VIC AB 0 CT1 36 (SUTURE) ×4 IMPLANT
SUT VIC AB 0 CTX 36 (SUTURE) ×6
SUT VIC AB 0 CTX36XBRD ANBCTRL (SUTURE) ×2 IMPLANT
SUT VIC AB 2-0 CT1 27 (SUTURE) ×2
SUT VIC AB 2-0 CT1 TAPERPNT 27 (SUTURE) ×1 IMPLANT
SUT VIC AB 4-0 KS 27 (SUTURE) ×1 IMPLANT
SYR CONTROL 10ML LL (SYRINGE) IMPLANT
TOWEL OR 17X24 6PK STRL BLUE (TOWEL DISPOSABLE) ×2 IMPLANT
TRAY FOLEY W/BAG SLVR 14FR LF (SET/KITS/TRAYS/PACK) IMPLANT
WATER STERILE IRR 1000ML POUR (IV SOLUTION) ×2 IMPLANT

## 2020-06-06 NOTE — Transfer of Care (Signed)
Immediate Anesthesia Transfer of Care Note  Patient: Catherine Hill  Procedure(s) Performed: Primary CESAREAN SECTION (N/A )  Patient Location: PACU  Anesthesia Type:Spinal  Level of Consciousness: awake, alert  and oriented  Airway & Oxygen Therapy: Patient Spontanous Breathing  Post-op Assessment: Report given to RN and Post -op Vital signs reviewed and stable  Post vital signs: Reviewed and stable  Last Vitals:  Vitals Value Taken Time  BP 107/70 06/06/20 0903  Temp    Pulse 66 06/06/20 0907  Resp 17 06/06/20 0907  SpO2 100 % 06/06/20 0907  Vitals shown include unvalidated device data.  Last Pain:  Vitals:   06/06/20 0619  TempSrc: Oral  PainSc: 0-No pain         Complications: No complications documented.

## 2020-06-06 NOTE — H&P (Signed)
Catherine Hill is a 35 y.o. G2P0010 at [redacted]w[redacted]d presenting for elective primary cesarean section due to polyhydramnios and suspected fetal macrosomia. Pt notes no contractions. Good fetal movement, No vaginal bleeding, not leaking fluid.  [facility] at [facility] since first trimester -IVF pregnancy -Advanced maternal age, normal preimplantation genetic testing, AFP negative -Marginal cord insertion GERD Increased weight gain and 55 pounds A negative.  Patient did not get RhoGAM as husband is also negative blood type -Fetal growth at 36 weeks, 7 pounds 14 ounces, 95th percentile, polyhydramnios with AFI at 26/99% -GBS positive   Prenatal Transfer Tool  Maternal Diabetes: No Genetic Screening: Normal Maternal Ultrasounds/Referrals: Normal Fetal Ultrasounds or other Referrals:  None Maternal Substance Abuse:  No Significant Maternal Medications:  None Significant Maternal Lab Results: Group B Strep positive     OB History    Gravida  2   Para  0   Term  0   Preterm  0   AB  1   Living  0     SAB  1   IAB  0   Ectopic  0   Multiple  0   Live Births             Past Medical History:  Diagnosis Date  . Allergy    seasonal  . Dysmenorrhea   . Endometriosis   . Fibrocystic breast disease   . Infertility, female   . Mononucleosis 02/2011  . Ovarian cyst 02/2011  . Vaginal Pap smear, abnormal    age 19, rpt normal   Past Surgical History:  Procedure Laterality Date  . LAPAROSCOPY  03/07/2011   Procedure: LAPAROSCOPY OPERATIVE;  Surgeon: Robley Fries, MD;  Location: [facility];  Service: Gynecology;  Laterality: Left;  Diagnostic laparoscopy and drainage of left ovarian cyst. Repair of cervical laceration.  . WISDOM TOOTH EXTRACTION     Family History: family history includes Cancer in her father, maternal aunt, and paternal grandfather; Diabetes in her maternal uncle; Hyperlipidemia in her father, mother, and sister; Thyroid disease in her mother; Vision loss  in her father. Social History:  reports that she has never smoked. She has never used smokeless tobacco. She reports previous alcohol use. She reports that she does not use drugs.  Review of Systems - Negative except Discomfort of pregnancy, anxiety   Today's Vitals   06/06/20 0540 06/06/20 0619  BP:  115/81  Pulse:  65  Resp:  18  Temp:  98 F (36.7 C)  TempSrc:  Oral  SpO2:  99%  Weight: 108 kg 108.2 kg  Height: 5\' 5"  (1.651 m) 5\' 5"  (1.651 m)  PainSc:  0-No pain   Body mass index is 39.69 kg/m.  Physical Exam:  Gen: well appearing, no distress   Abd: gravid, NT, no RUQ pain LE: trace edema, equal bilaterally, non-tender  CBC    Component Value Date/Time   WBC 8.4 06/04/2020 1044   RBC 3.76 (L) 06/04/2020 1044   HGB 11.9 (L) 06/04/2020 1044   HCT 34.9 (L) 06/04/2020 1044   PLT 180 06/04/2020 1044   MCV 92.8 06/04/2020 1044   MCH 31.6 06/04/2020 1044   MCHC 34.1 06/04/2020 1044   RDW 11.9 06/04/2020 1044   LYMPHSABS 2,242 02/20/2016 0941   MONOABS 472 02/20/2016 0941   EOSABS 59 02/20/2016 0941   BASOSABS 59 02/20/2016 0941     Prenatal labs: ABO, Rh: --/--/A NEG (04/26 0920) Antibody: NEG (04/26 0920) Rubella: Immune (10/06 0000) RPR: NON  REACTIVE (04/26 1044)  HBsAg: Negative (10/06 0000)  HIV: Non-reactive (10/06 0000)  GBS:   Positive 1 hr Glucola 122  Genetic screening normal PGS, AFP negative Anatomy US normal   Assessment/Plan: 35 y.o. G2P0010 at [redacted]w[redacted]d Elective primary cesarean section due to polyhydramnios and suspected fetal macrosomia.  Patient understands risk benefits of C-section but given her infertility and IVF pregnancy patient elects primary cesarean. GBS positive   Lendon Colonel 06/06/2020, 2:31 AM  Lendon Colonel 06/06/2020 7:21 AM

## 2020-06-06 NOTE — Anesthesia Preprocedure Evaluation (Addendum)
Anesthesia Evaluation  Patient identified by MRN, date of birth, ID band Patient awake    Reviewed: Allergy & Precautions, NPO status , Patient's Chart, lab work & pertinent test results  Airway Mallampati: II  TM Distance: >3 FB Neck ROM: Full    Dental no notable dental hx.    Pulmonary neg pulmonary ROS,    Pulmonary exam normal breath sounds clear to auscultation       Cardiovascular negative cardio ROS Normal cardiovascular exam Rhythm:Regular Rate:Normal     Neuro/Psych negative neurological ROS  negative psych ROS   GI/Hepatic negative GI ROS, Neg liver ROS,   Endo/Other    Renal/GU negative Renal ROS  negative genitourinary   Musculoskeletal negative musculoskeletal ROS (+)   Abdominal   Peds negative pediatric ROS (+)  Hematology negative hematology ROS (+)   Anesthesia Other Findings   Reproductive/Obstetrics (+) Pregnancy                            Anesthesia Physical Anesthesia Plan  ASA: II  Anesthesia Plan: Spinal   Post-op Pain Management:    Induction:   PONV Risk Score and Plan: 2 and Scopolamine patch - Pre-op, Treatment may vary due to age or medical condition and Ondansetron  Airway Management Planned: Natural Airway  Additional Equipment: None  Intra-op Plan:   Post-operative Plan:   Informed Consent: I have reviewed the patients History and Physical, chart, labs and discussed the procedure including the risks, benefits and alternatives for the proposed anesthesia with the patient or authorized representative who has indicated his/her understanding and acceptance.       Plan Discussed with: Anesthesiologist, CRNA and Surgeon  Anesthesia Plan Comments:         Anesthesia Quick Evaluation

## 2020-06-06 NOTE — Op Note (Signed)
06/06/2020  8:42 AM  PATIENT:  Catherine Hill  35 y.o. female  PRE-OPERATIVE DIAGNOSIS:  Fetal Macrosomia, Polyhydramnios, Advanced Maternal Age, IVF Pregnancy  POST-OPERATIVE DIAGNOSIS:  Fetal Macrosomia, Polyhydramnios, Advanced Maternal Age, IVF Pregnancy  PROCEDURE:  Procedure(s) with comments: Primary CESAREAN SECTION (N/A) - EDD: 06/13/20 LTCS with 2 layer closure  SURGEON:  Surgeon(s) and Role:    * Noland Fordyce, MD - Primary  PHYSICIAN ASSISTANT:   ASSISTANTS: Ivonne Andrew, CNM   ANESTHESIA:   spinal  EBL:  599 mL   BLOOD ADMINISTERED:none  DRAINS: Urinary Catheter (Foley)   LOCAL MEDICATIONS USED:  NONE  SPECIMEN:  Source of Specimen:  placenta  DISPOSITION OF SPECIMEN:  L&D for disporal  COUNTS:  YES  TOURNIQUET:  * No tourniquets in log *  DICTATION: .Note written in EPIC  PLAN OF CARE: Admit to inpatient   PATIENT DISPOSITION:  PACU - hemodynamically stable.   Delay start of Pharmacological VTE agent (>24hrs) due to surgical blood loss or risk of bleeding: yes     Findings:  @BABYSEXEBC @ infant,  APGAR (1 MIN): 9   APGAR (5 MINS): 10   APGAR (10 MINS):   Normal uterus, tubes and ovaries, normal placenta. 3VC, clear amniotic fluid  EBL: 600 cc Antibiotics:   2g Ancef Complications: none  Indications: This is a 35 y.o. year-old, G1  At [redacted]w[redacted]d admitted for [facility] for suspected macrosomia. Risks benefits and alternatives of the procedure were discussed with the patient who agreed to proceed  Procedure:  After informed consent was obtained the patient was taken to the operating room where spinal anesthesia was initiated.  She was prepped and draped in the normal sterile fashion in dorsal supine position with a leftward tilt.  A foley catheter was in place.  A Pfannenstiel skin incision was made 2 cm above the pubic symphysis in the midline with the scalpel.  Dissection was carried down with the Bovie cautery until the fascia was reached. The fascia  was incised in the midline. The incision was extended laterally with the Mayo scissors. The inferior aspect of the fascial incision was grasped with the Coker clamps, elevated up and the underlying rectus muscles were dissected off sharply. The superior aspect of the fascial incision was grasped with the Coker clamps elevated up and the underlying rectus muscles were dissected off sharply.  The peritoneum was enetered bluntly. The peritoneal incision was extended superiorly and inferiorly with good visualization of the bladder. The bladder blade was inserted and palpation was done to assess the fetal position and the location of the uterine vessels. A bladder flap was created sharply. The lower segment of the uterus was incised sharply with the scalpel and extended  bluntly in the cephalo-caudal fashion. The infant was grasped, brought to the incision,  rotated and the infant was delivered with fundal pressure. The nose and mouth were bulb suctioned. The cord was clamped and cut after 1 minute delay. The infant was handed off to the waiting pediatrician. The placenta was expressed. The uterus was exteriorized. The uterus was cleared of all clots and debris. The uterine incision was repaired with 0 Vicryl in a running locked fashion.  A second layer of the same suture was used in an imbricating fashion to obtain excellent hemostasis.  2 additional figure of 8 sutures were placed in the midline for additional hemostasis. The uterus was then returned to the abdomen, the gutters were cleared of all clots and debris. The uterine incision was reinspected  and found to be hemostatic but still small ooze was noted. Gutters were cleared again, no surgical bleeding could be found from the uterus. Small ooze wwas noted from the bladder flap and Arista was placed.. The peritoneum was grasped and closed with 2-0 Vicryl in a running fashion. The cut muscle edges and the underside of the fascia were inspected and found to be  hemostatic. The fascia was closed with 0 Vicryl in a single layer . The subcutaneous tissue was irrigated. Scarpa's layer was closed with a 2-0 plain gut suture. The skin was closed with a 4-0 Monocryl in a single layer. The patient tolerated the procedure well. Sponge lap and needle counts were correct x3 and patient was taken to the recovery room in a stable condition.  Lendon Colonel 06/06/2020 8:44 AM

## 2020-06-06 NOTE — Lactation Note (Signed)
This note was copied from a baby's chart. Lactation Consultation Note  Patient Name: Catherine Hill [id number] Date: 06/06/2020 Reason for consult: Initial assessment;Primapara;1st time breastfeeding;Term Age:35 hours  Initial visit to 6 hours old infant of a P1 mother. Infant is sleeping in grandmother's arms upon arrival. Talked to mother about hand expression and demonstrated technique. Colostrum easily expressed. Mother states baby has latched well twice and reports no pain.  LC talked about normal newborn behavior during the first days of life.    Plan: 1-Skin to skin 2-Aim for a deep, comfortable latch 3-Breastfeeding on demand or 8-12 times in 24h period. 4-Keep infant awake during breastfeeding session: massaging breast, infant's hand/shoulder/feet 5-Monitor voids and stools as signs good intake.  6-Encouraged maternal rest, hydration and food intake.  7-Contact LC as needed for feeds/support/concerns/questions   All questions answered at this time. Provided Lactation services brochure and promoted [employer] booklet information.    Maternal Data Has patient been taught Hand Expression?: Yes Does the patient have breastfeeding experience prior to this delivery?: No  Feeding Mother's Current Feeding Choice: Breast Milk  LATCH Score Latch: Grasps breast easily, tongue down, lips flanged, rhythmical sucking.  Audible Swallowing: A few with stimulation  Type of Nipple: Everted at rest and after stimulation  Comfort (Breast/Nipple): Soft / non-tender  Hold (Positioning): Assistance needed to correctly position infant at breast and maintain latch.  LATCH Score: 8  Interventions Interventions: Breast feeding basics reviewed;Breast massage;Hand express;Skin to skin;Expressed milk;Education  Discharge Pump: Personal WIC Program: No  Consult Status Consult Status: Follow-up Date: 06/07/20 Follow-up type: In-patient    Catherine Hill A Higuera Catherine Hill 06/06/2020, 2:19  PM

## 2020-06-06 NOTE — Anesthesia Procedure Notes (Signed)
Spinal  Patient location during procedure: OR Start time: 06/06/2020 7:26 AM End time: 06/06/2020 7:30 AM Reason for block: surgical anesthesia Staffing Performed: anesthesiologist  Anesthesiologist: Mellody Dance, MD Preanesthetic Checklist Completed: patient identified, IV checked, risks and benefits discussed, surgical consent, monitors and equipment checked, pre-op evaluation and timeout performed Spinal Block Patient position: sitting Prep: DuraPrep Patient monitoring: cardiac monitor, continuous pulse ox and blood pressure Approach: midline Location: L3-4 Injection technique: single-shot Needle Needle type: Pencan  Needle gauge: 24 G Needle length: 9 cm Assessment Events: CSF return Additional Notes Functioning IV was confirmed and monitors were applied. Sterile prep and drape, including hand hygiene and sterile gloves were used. The patient was positioned and the spine was prepped. The skin was anesthetized with lidocaine.  Free flow of clear CSF was obtained prior to injecting local anesthetic into the CSF.  The spinal needle aspirated freely following injection.  The needle was carefully withdrawn.  The patient tolerated the procedure well.

## 2020-06-06 NOTE — Brief Op Note (Signed)
06/06/2020  8:42 AM  PATIENT:  Catherine Hill  35 y.o. female  PRE-OPERATIVE DIAGNOSIS:  Fetal Macrosomia, Polyhydramnios, Advanced Maternal Age, IVF Pregnancy  POST-OPERATIVE DIAGNOSIS:  Fetal Macrosomia, Polyhydramnios, Advanced Maternal Age, IVF Pregnancy  PROCEDURE:  Procedure(s) with comments: Primary CESAREAN SECTION (N/A) - EDD: 06/13/20 LTCS with 2 layer closure  SURGEON:  Surgeon(s) and Role:    * Noland Fordyce, MD - Primary  PHYSICIAN ASSISTANT:   ASSISTANTS: Ivonne Andrew, CNM   ANESTHESIA:   spinal  EBL:  599 mL   BLOOD ADMINISTERED:none  DRAINS: Urinary Catheter (Foley)   LOCAL MEDICATIONS USED:  NONE  SPECIMEN:  Source of Specimen:  placenta  DISPOSITION OF SPECIMEN:  L&D for disporal  COUNTS:  YES  TOURNIQUET:  * No tourniquets in log *  DICTATION: .Note written in EPIC  PLAN OF CARE: Admit to inpatient   PATIENT DISPOSITION:  PACU - hemodynamically stable.   Delay start of Pharmacological VTE agent (>24hrs) due to surgical blood loss or risk of bleeding: yes

## 2020-06-06 NOTE — Anesthesia Postprocedure Evaluation (Signed)
Anesthesia Post Note  Patient: Catherine Hill  Procedure(s) Performed: Primary CESAREAN SECTION (N/A )     Patient location during evaluation: Mother Baby Anesthesia Type: Spinal Level of consciousness: oriented and awake and alert Pain management: pain level controlled Vital Signs Assessment: post-procedure vital signs reviewed and stable Respiratory status: spontaneous breathing and respiratory function stable Cardiovascular status: blood pressure returned to baseline and stable Postop Assessment: no headache, no backache, no apparent nausea or vomiting and able to ambulate Anesthetic complications: no   No complications documented.  Last Vitals:  Vitals:   06/06/20 1004 06/06/20 1005  BP:    Pulse: 64 (!) 58  Resp: 15   Temp:    SpO2: 100% 100%    Last Pain:  Vitals:   06/06/20 0930  TempSrc:   PainSc: 0-No pain   Pain Goal:    LLE Motor Response: Purposeful movement (06/06/20 0945)   RLE Motor Response: Purposeful movement (06/06/20 0945)       Epidural/Spinal Function Cutaneous sensation: Tingles (06/06/20 0945), Patient able to flex knees: Yes (06/06/20 0945), Patient able to lift hips off bed: No (06/06/20 0945), Back pain beyond tenderness at insertion site: No (06/06/20 0945), Progressively worsening motor and/or sensory loss: No (06/06/20 0945), Bowel and/or bladder incontinence post epidural: No (06/06/20 0945)  Mellody Dance

## 2020-06-07 DIAGNOSIS — Z6791 Unspecified blood type, Rh negative: Secondary | ICD-10-CM | POA: Diagnosis present

## 2020-06-07 DIAGNOSIS — O9902 Anemia complicating childbirth: Secondary | ICD-10-CM | POA: Diagnosis not present

## 2020-06-07 LAB — CBC
HCT: 28.2 % — ABNORMAL LOW (ref 36.0–46.0)
Hemoglobin: 9.7 g/dL — ABNORMAL LOW (ref 12.0–15.0)
MCH: 32.3 pg (ref 26.0–34.0)
MCHC: 34.4 g/dL (ref 30.0–36.0)
MCV: 94 fL (ref 80.0–100.0)
Platelets: 182 10*3/uL (ref 150–400)
RBC: 3 MIL/uL — ABNORMAL LOW (ref 3.87–5.11)
RDW: 12.1 % (ref 11.5–15.5)
WBC: 15.3 10*3/uL — ABNORMAL HIGH (ref 4.0–10.5)
nRBC: 0 % (ref 0.0–0.2)

## 2020-06-07 LAB — BIRTH TISSUE RECOVERY COLLECTION (PLACENTA DONATION)

## 2020-06-07 NOTE — Lactation Note (Deleted)
This note was copied from a baby's chart. Lactation Consultation Note  Patient Name: Catherine Hill [id number] Date: 06/07/2020   Age:35 hours  LC in to see Mom, will return for next feeding.  Maternal Data    Feeding    LATCH Score Latch: Grasps breast easily, tongue down, lips flanged, rhythmical sucking.  Audible Swallowing: A few with stimulation  Type of Nipple: Everted at rest and after stimulation  Comfort (Breast/Nipple): Soft / non-tender  Hold (Positioning): Assistance needed to correctly position infant at breast and maintain latch.  LATCH Score: 8   Lactation Tools Discussed/Used    Interventions Interventions: DEBP  Discharge    Consult Status      Amario Longmore  Nicholson-Springer 06/07/2020, 4:02 PM

## 2020-06-07 NOTE — Lactation Note (Signed)
This note was copied from a baby's chart. Lactation Consultation Note  Patient Name: Catherine Hill [id number] Date: 06/07/2020 Reason for consult: Follow-up assessment Age:35 hours  Follow up visit to 37 hours infant, per mother's request. Mother is a primipara, first-time breastfeeding. Mother requests some assistance to pump using manual pump and DEBP. LC demonstrated how to use/care/frequency.   Parents share infant has been clusterfeeding. Infant is in basinet with a pacifier, suckling and quickly started crying. FOI placed him skin to skin but infant is inconsolable. LC offered assistance with latch. Set up support pillows for football position to right breast. Demonstrated alignment. Hand expressed for enticement and infant latched at once. Noted suckling and swallowing. Mother denies discomfort or pain.   Educated mom about deep latch for an optimal breastfeeding session.  Plan:   1-Breastfeeding on demand, ensuring a deep, comfortable latch.  2-Undressing infant and place skin to skin when ready to breastfeed 3-Keep infant awake during breastfeeding session: massaging breast, infant's hand/shoulder/feet 4-Monitor voids and stools as signs good intake.  5-Contact LC as needed for feeds/support/concerns/questions   Feeding Mother's Current Feeding Choice: Breast Milk  LATCH Score Latch: Grasps breast easily, tongue down, lips flanged, rhythmical sucking.  Audible Swallowing: Spontaneous and intermittent  Type of Nipple: Everted at rest and after stimulation  Comfort (Breast/Nipple): Soft / non-tender  Hold (Positioning): Assistance needed to correctly position infant at breast and maintain latch.  LATCH Score: 9  Interventions Interventions: DEBP;Education;Hand pump;Expressed milk;Position options;Support pillows;Adjust position;Breast compression;Hand express;Breast massage;Skin to skin;Assisted with latch;Breast feeding basics reviewed  Consult Status Consult Status:  Follow-up Date: 06/08/20 Follow-up type: In-patient    Alyah Boehning A Higuera Ancidey 06/07/2020, 8:06 PM

## 2020-06-07 NOTE — Progress Notes (Signed)
Subjective: POD# 1 Live born female  Birth Weight: 8 lb 10.6 oz (3930 g) APGAR: 9, 10  Newborn Delivery   Birth date/time: 06/06/2020 08:02:00 Delivery type: C-Section, Low Transverse Trial of labor: No C-section categorization: Primary     Baby name: Graves Delivering provider: Noland Fordyce   Circumcision Yes, planning  Feeding: breast  Pain control at delivery: Spinal   Reports feeling well overall. Reports pain with ambulation. Able to ambulate without assistance. Breastfeeding going well. Husband at the bedside  Patient reports tolerating PO.   Pain controlled with ibuprofen (OTC) and narcotic analgesics including OxyIR Denies HA/SOB/C/P/N/V/dizziness. She reports vaginal bleeding as normal, without clots. She is ambulating and urinating without difficulty.     Objective:   Vitals:   06/06/20 1747 06/06/20 2011 06/06/20 2337 06/07/20 0530  BP:  107/60 (!) 108/54 110/68  Pulse:  63 72 (!) 59  Resp:  19 18 19   Temp:  97.9 F (36.6 C) 98.1 F (36.7 C)   TempSrc:  Oral Oral   SpO2: 98% 99% 98% 99%  Weight:      Height:         Intake/Output Summary (Last 24 hours) at 06/07/2020 0930 Last data filed at 06/07/2020 0700 Gross per 24 hour  Intake 787.57 ml  Output 2875 ml  Net -2087.43 ml      Recent Labs    06/04/20 1044 06/07/20 0511  WBC 8.4 15.3*  HGB 11.9* 9.7*  HCT 34.9* 28.2*  PLT 180 182    Blood type: --/--/A NEG (04/26 0920)  Rubella: Immune (10/06 0000)   Vaccines:   TDaP          UTD                      COVID-19 UTD  Physical Exam:  General: alert and cooperative CV: Regular rate and rhythm Resp: clear Abdomen: soft, nontender, normal bowel sounds Incision: clean, dry and intact Uterine Fundus: firm, below umbilicus, nontender Lochia: minimal Ext: extremities normal, atraumatic, no cyanosis or edema and edema 2+ non-pitting to lower extremities  Assessment/Plan: 35 y.o.   POD# 1. 31                  Principal Problem:    Postpartum care following cesarean delivery - 4/28  Encourage rest when baby rests  Breastfeeding support  Encourage to ambulate  Routine post-op care Active Problems:   Fetal macrosomia   Cesarean delivery - suspect macrosomia   S/P cesarean section   RhD negative  Baby is Rh negative   Maternal anemia, with delivery  Start PO Niferex and mag oxide  Anticipate discharge home tomorrow.             04-17-1979, CNM, MSN 06/07/2020, 9:30 AM

## 2020-06-08 MED ORDER — IBUPROFEN 600 MG PO TABS: 600.0000 mg | ORAL_TABLET | Freq: Four times a day (QID) | ORAL | 0 refills | Status: DC

## 2020-06-08 MED ORDER — HYDROCHLOROTHIAZIDE 25 MG PO TABS: 25.0000 mg | ORAL_TABLET | Freq: Every day | ORAL | 0 refills | Status: DC

## 2020-06-08 MED ORDER — ACETAMINOPHEN 500 MG PO TABS: 1000.0000 mg | ORAL_TABLET | Freq: Four times a day (QID) | ORAL | 0 refills | Status: DC

## 2020-06-08 MED ORDER — HYDROCHLOROTHIAZIDE 25 MG PO TABS
25.0000 mg | ORAL_TABLET | Freq: Every day | ORAL | Status: DC
  Administered 2020-06-08: 25 mg via ORAL
  Filled 2020-06-08: qty 1

## 2020-06-08 MED ORDER — OXYCODONE HCL 5 MG PO TABS: 5.0000 mg | ORAL_TABLET | ORAL | 0 refills | Status: DC | PRN

## 2020-06-08 NOTE — Progress Notes (Signed)
To pt's room to discharge patient. Pt states that she is really dizzy and nauseous with "more swelling than usual".  BP 107/68 pulse 73.  Patient wanted me to call Dorisann Frames, CNM to make her aware and she what she wanted her to do.  Per CNM, have pt sit for a few minutes then have her walk to halls. After she is done with walking, call CNM back to let her know how she feels.

## 2020-06-08 NOTE — Lactation Note (Signed)
This note was copied from a baby's chart. Lactation Consultation Note  Patient Name: Catherine Hill Date: 06/08/2020 Reason for consult: Follow-up assessment;Mother's request;Difficult latch;1st time breastfeeding;Term;Infant weight loss (-7% weight loss, C/S delivery) Age:35 hours P1, term female infant -7% weight loss. Infant had one void and one stool while LC was in the room. Infant was cuing when [facility] entering the room. Per mom, infant has not been sustaining latch and falling asleep when breastfeeding. Mom latched infant on her right breast using the football hold position and infant was supplemented at the breast with 7 mls of donor breast milk, infant sustained latch and breastfeed for 15 minutes, infant appeared content after the feeding. Mom will continue to work towards latching infant at the breast and will ask RN or LC  for assistance with latching infant  at the breast  if needed. Dad will assist mom with supplementing infant at the breast using a curve tip syringe.  Per mom, she only used DEBP partially one time, LC discussed the importance of pumping due to infant previously not latching well and to help establish her milk supply. Mom plans to start pumping every 3 hours for 15 minutes on initial setting as previously advised by LC earlier today. Mom knows to breastfeed infant according to cues, 8 to 12 or more times within 24 hours, STS.  Maternal Data    Feeding Mother's Current Feeding Choice: Breast Milk and Donor Milk  LATCH Score Latch: Grasps breast easily, tongue down, lips flanged, rhythmical sucking. (Infant was supplemented at the breast.)  Audible Swallowing: Spontaneous and intermittent  Type of Nipple: Everted at rest and after stimulation  Comfort (Breast/Nipple): Soft / non-tender  Hold (Positioning): Assistance needed to correctly position infant at breast and maintain latch.  LATCH Score: 9   Lactation Tools Discussed/Used     Interventions Interventions: Skin to skin;Breast massage;Hand express;Breast compression;Adjust position;Support pillows;Position options;DEBP;Education  Discharge    Consult Status Consult Status: Follow-up Date: 06/08/20 Follow-up type: In-patient    Danelle Earthly 06/08/2020, 2:13 AM

## 2020-06-08 NOTE — Discharge Instructions (Signed)
Linda Coppola, IBCLC [facility] for lactation support.  

## 2020-06-08 NOTE — Lactation Note (Signed)
This note was copied from a baby's chart. Lactation Consultation Note  Patient Name: [name] [id number] Date: 06/08/2020 Reason for consult: Follow-up assessment;Primapara;1st time breastfeeding;Infant weight loss;Term Age:35 hours  Visited with mom of 11 hours old FT female, she's a P1. Mom and baby are going home today; baby is now at 9% weight loss. Family has been working with lactation and mom has decided to start supplementing with formula once she gets home since baby won't have any more donor milk.  Reviewed discharge education, pumping schedule, lactogenesis II and normal newborn behavior on day 3. Per mom BF is going better now, last LATCH score was 8. Encouraged mom to continue putting baby to breast 8-12 times/24 hours or sooner if feeding cues are present. She was also encouraged to pump whenever baby is getting formula.  FOB present and supportive. Family reported all questions and concerns were answered, they're both aware of LC OP services and will contact PRN.  Maternal Data    Feeding Mother's Current Feeding Choice: Breast Milk and Donor Milk  LATCH Score    Lactation Tools Discussed/Used Tools: Pump Breast pump type: Double-Electric Breast Pump Pump Education: Setup, frequency, and cleaning;Milk Storage Reason for Pumping: donor milk supplementation Pumping frequency: q 3 hours  Interventions Interventions: Breast feeding basics reviewed;DEBP;Education  Discharge Discharge Education: Engorgement and breast care;Warning signs for feeding baby Pump: DEBP  Consult Status Consult Status: Complete Date: 06/08/20 Follow-up type: Call as needed    [name] 06/08/2020, 6:41 PM

## 2020-06-08 NOTE — Lactation Note (Signed)
This note was copied from a baby's chart. Lactation Consultation Note  Patient Name: Catherine Hill [id number] Date: 06/08/2020 Reason for consult: Follow-up assessment Age:35 hours  0940 - 1000 - I followed up with Catherine Hill. She states that the pediatrician has seen them today and has recommended supplementing with DBM or her EBM due to weight loss (10-11%). Baby has had 10 stools in first 49 hours of life. She reports that baby is able to latch, but sometimes falls asleep at the breast.  Catherine Hill is also pumping and states that her milk is beginning to look different. I asked her to hand express and noted transitional milk. Baby just had approximately 25 mls prior to my entry, and I encouraged Catherine Hill to pump now and to use any EBM for supplementation at the next feeding session.  The feeding plan today is to continue to feed baby Catherine Hill at the breast 8-12 times a day, and to supplement EBM or DBM after, and pump after each feeding.   I encouraged Catherine Hill to call lactation for a feeding assessment today. She verbalized understanding.  Catherine Hill states that she will follow up with [facility]. I discussed community breast feeding resources and recommended a follow up OP appointment. She expressed interest and provided consent for me to put in an OP referral.  Feeding Mother's Current Feeding Choice: Breast Milk and Donor Milk Nipple Type: Slow - flow  LATCH Score Latch: Repeated attempts needed to sustain latch, nipple held in mouth throughout feeding, stimulation needed to elicit sucking reflex.  Audible Swallowing: Spontaneous and intermittent  Type of Nipple: Everted at rest and after stimulation  Comfort (Breast/Nipple): Soft / non-tender  Hold (Positioning): Assistance needed to correctly position infant at breast and maintain latch.  LATCH Score: 8   Lactation Tools Discussed/Used    Interventions Interventions: Education;Breast feeding basics  reviewed  Discharge    Consult Status Consult Status: Follow-up Date: 06/08/20 Follow-up type: In-patient    Walker Shadow 06/08/2020, 10:38 AM

## 2020-06-08 NOTE — Discharge Summary (Signed)
OB Discharge Summary  Patient Name: Catherine Hill DOB: [birth year] MRN: [id number]  Date of admission: 06/06/2020 Delivering provider: Noland Fordyce   Admitting diagnosis: Fetal macrosomia [O36.60X0] S/P cesarean section [Z98.891] Intrauterine pregnancy: [redacted]w[redacted]d     Secondary diagnosis: Patient Active Problem List   Diagnosis Date Noted  . RhD negative 06/07/2020  . Maternal anemia, with delivery 06/07/2020  . Fetal macrosomia 06/06/2020  . Cesarean delivery - suspect macrosomia 06/06/2020  . Postpartum care following cesarean delivery - 4/28 06/06/2020  . S/P cesarean section 06/06/2020    Date of discharge: 06/08/2020   Discharge diagnosis: Principal Problem:   Postpartum care following cesarean delivery - 4/28 Active Problems:   Fetal macrosomia   Cesarean delivery - suspect macrosomia   S/P cesarean section   RhD negative   Maternal anemia, with delivery                                                        Post partum procedures:rhogam  Augmentation: N/A Pain control: Spinal  Laceration:None  Episiotomy:None  Complications: None  Hospital course:  Scheduled C/S   35 y.o. yo G2P1011 at [redacted]w[redacted]d was admitted to the hospital 06/06/2020 for scheduled cesarean section with the following indication:Macrosomia.Delivery details are as follows:  Membrane Rupture Time/Date: 8:01 AM ,06/06/2020   Delivery Method:C-Section, Low Transverse  Details of operation can be found in separate operative note.  Patient had an uncomplicated postpartum course.  She is ambulating, tolerating a regular diet, passing flatus, and urinating well. Patient is discharged home in stable condition on  06/08/20        Newborn Data: Birth date:06/06/2020  Birth time:8:02 AM  Gender:Female  Living status:Living  Apgars:9 ,10  Weight:3930 g     Physical exam  Vitals:   06/07/20 0530 06/07/20 1646 06/07/20 2139 06/08/20 0606  BP: 110/68 124/72 117/60 113/73  Pulse: (!) 59 66 61 65  Resp: 19 18 19 18    Temp:  98 F (36.7 C) 98.2 F (36.8 C) 97.7 F (36.5 C)  TempSrc:  Oral Oral Oral  SpO2: 99% 100% 100% 100%  Weight:      Height:       General: alert, cooperative and no distress Lochia: appropriate Uterine Fundus: firm Incision: Healing well with no significant drainage, Dressing is clean, dry, and intact DVT Evaluation: No evidence of DVT seen on physical exam. Calf/Ankle edema is present Labs: Lab Results  Component Value Date   WBC 15.3 (H) 06/07/2020   HGB 9.7 (L) 06/07/2020   HCT 28.2 (L) 06/07/2020   MCV 94.0 06/07/2020   PLT 182 06/07/2020   CMP Latest Ref Rng & Units 04/11/2020  Glucose 70 - 99 mg/dL 94  BUN 6 - 20 mg/dL 12  Creatinine 06/11/2020 - 6.27 mg/dL 0.35  Sodium 0.09 - 381 mmol/L 133(L)  Potassium 3.5 - 5.1 mmol/L 3.7  Chloride 98 - 111 mmol/L 105  CO2 22 - 32 mmol/L 19(L)  Calcium 8.9 - 10.3 mg/dL 829)  Total Protein 6.0 - 8.3 g/dL -  Total Bilirubin 0.2 - 1.2 mg/dL -  Alkaline Phos 39 - 9.3(Z U/L -  AST 0 - 37 U/L -  ALT 0 - 35 U/L -   Edinburgh Postnatal Depression Scale Screening Tool 06/06/2020  I have been able to laugh and see the funny  side of things. (No Data)    Vaccines: TDaP UTD         COVID-19   UTD  Discharge instructions:  per After Visit Summary   After Visit Meds:  Allergies as of 06/08/2020   No Known Allergies     Medication List    STOP taking these medications   ondansetron 4 MG disintegrating tablet Commonly known as: ZOFRAN-ODT     TAKE these medications   acetaminophen 500 MG tablet Commonly known as: TYLENOL Take 2 tablets (1,000 mg total) by mouth every 6 (six) hours.   cetirizine 10 MG tablet Commonly known as: ZYRTEC Take 10 mg by mouth daily.   folic acid 400 MCG tablet Commonly known as: FOLVITE Take 400 mcg by mouth daily.   hydrochlorothiazide 25 MG tablet Commonly known as: HYDRODIURIL Take 1 tablet (25 mg total) by mouth daily for 4 days.   ibuprofen 600 MG tablet Commonly known as:  ADVIL Take 1 tablet (600 mg total) by mouth every 6 (six) hours.   oxyCODONE 5 MG immediate release tablet Commonly known as: Oxy IR/ROXICODONE Take 1-2 tablets (5-10 mg total) by mouth every 4 (four) hours as needed for moderate pain.   prenatal multivitamin Tabs tablet Take 1 tablet by mouth daily at 12 noon.   Vitamin D 50 MCG (2000 UT) tablet Take 2,000 Units by mouth daily.   zinc gluconate 50 MG tablet Take 50 mg by mouth daily.      Diet: routine diet  Activity: Advance as tolerated. Pelvic rest for 6 weeks.   Newborn Data: Live born female  Birth Weight: 8 lb 10.6 oz (3930 g) APGAR: 9, 10  Newborn Delivery   Birth date/time: 06/06/2020 08:02:00 Delivery type: C-Section, Low Transverse Trial of labor: No C-section categorization: Primary     Named Graves Baby Feeding: Breast Disposition:home with mother  Delivery Report:  Review the Delivery Report for details.    Follow up:  Follow-up Information    Shea Evans, MD. Schedule an appointment as soon as possible for a visit in 6 week(s).   Specialty: Obstetrics and Gynecology Contact information: [address] [place] [address] [phone]              Clancy Gourd, MSN 06/08/2020, 10:39 AM

## 2021-01-29 ENCOUNTER — Telehealth (INDEPENDENT_AMBULATORY_CARE_PROVIDER_SITE_OTHER): Payer: 59 | Admitting: Family Medicine

## 2021-01-29 ENCOUNTER — Other Ambulatory Visit: Payer: Self-pay

## 2021-01-29 ENCOUNTER — Other Ambulatory Visit (INDEPENDENT_AMBULATORY_CARE_PROVIDER_SITE_OTHER): Payer: 59

## 2021-01-29 ENCOUNTER — Encounter: Payer: Self-pay | Admitting: Family Medicine

## 2021-01-29 ENCOUNTER — Other Ambulatory Visit: Payer: Self-pay | Admitting: *Deleted

## 2021-01-29 VITALS — BP 122/68 | HR 71 | Temp 99.2°F | Ht 65.5 in | Wt 209.0 lb

## 2021-01-29 DIAGNOSIS — J029 Acute pharyngitis, unspecified: Secondary | ICD-10-CM | POA: Diagnosis not present

## 2021-01-29 LAB — POCT RAPID STREP A (OFFICE): Rapid Strep A Screen: NEGATIVE

## 2021-01-29 NOTE — Progress Notes (Signed)
Start time: 11:54 End time: 12:10  Virtual Visit via Video Note  I connected with Catherine Hill on 01/29/21 by a video enabled telemedicine application and verified that I am speaking with the correct person using two identifiers.  Location: Patient: home Provider: office   I discussed the limitations of evaluation and management by telemedicine and the availability of in person appointments. The patient expressed understanding and agreed to proceed.  History of Present Illness:  Chief Complaint  Patient presents with   Sore Throat    VIRTUAL ST x 3 days and low grade fever since Monday. Feeling very run down. Had covid 2 months ago. Just took a covid test this morning and it was negative. Has an 58 month old that is in daycare and is on abx for ear infection.    12/18 felt a little fatigued (but has a sick baby).  She had slight dry throat. 12/19 she started with sore throat, pain with swallowing, feels tender on the front of her neck/throat. Denies sniffling, sneezing, runny nose. Denies sinus pain or PND. Denies any cough.  Tonsils are big, red, no exudates/white spots noted by patient.  She does note that her tongue looks a little gray. Forehead thermometer 99.8, has been running LG fever.  Son had RSV 6 weeks ago. Dx'd with OM on Saturday.  Son in daycare, has had many ear infections, planning for tubes. Has taken amox at least 2x, on augmentin now  PMH, PSH, SH reviewed  Outpatient Encounter Medications as of 01/29/2021  Medication Sig Note   Cholecalciferol (VITAMIN D) 50 MCG (2000 UT) tablet Take 2,000 Units by mouth daily.    folic acid (FOLVITE) 400 MCG tablet Take 400 mcg by mouth daily.    ibuprofen (ADVIL) 600 MG tablet Take 1 tablet (600 mg total) by mouth every 6 (six) hours. 01/29/2021: Last dose 4pm yesterday   Prenatal Vit-Fe Fumarate-FA (PRENATAL MULTIVITAMIN) TABS tablet Take 1 tablet by mouth daily at 12 noon.    acetaminophen (TYLENOL) 500 MG tablet  Take 2 tablets (1,000 mg total) by mouth every 6 (six) hours. (Patient not taking: Reported on 01/29/2021) 01/29/2021: As needed   cetirizine (ZYRTEC) 10 MG tablet Take 10 mg by mouth daily. (Patient not taking: Reported on 01/29/2021) 01/29/2021: As needed   zinc gluconate 50 MG tablet Take 50 mg by mouth daily. (Patient not taking: Reported on 01/29/2021) 01/29/2021: occasionally   [DISCONTINUED] hydrochlorothiazide (HYDRODIURIL) 25 MG tablet Take 1 tablet (25 mg total) by mouth daily for 4 days.    [DISCONTINUED] oxyCODONE (OXY IR/ROXICODONE) 5 MG immediate release tablet Take 1-2 tablets (5-10 mg total) by mouth every 4 (four) hours as needed for moderate pain.    No facility-administered encounter medications on file as of 01/29/2021.   ROS:  LG fever, ST per HPI. No other URI sx currently.  No rash, n/v/d On day 2 of her cycle now--heavy, cramping, might contribute to fatigue.   Observations/Objective:  BP 122/68    Pulse 71    Temp 99.2 F (37.3 C) (Temporal)    Ht 5' 5.5" (1.664 m)    Wt 209 lb (94.8 kg)    LMP 01/28/2021    Breastfeeding No    BMI 34.25 kg/m   Well-appearing female in no distress. She is alert, oriented, speaking easily. She doesn't sound congested. No throat clearing or coughing. Doesn't appear to be in pain with swallowing. Exam is limited due to virtual nature of the visit.  Rapid strep  test negative   Assessment and Plan:  Pharyngitis, unspecified etiology - Ddx reviewed. No other URI sx at this time. Supportive measures reviewed. f/u if persists/worsen. Can send photos of throat if changes  We discussed the various causes of sore throat--there are many viruses that can cause this. Treatment would just be supportive--treating symptoms. Salt water gargles, tylenol, ibuprofen. If you notice any changes in the appearance of your throat (ulcers, white spots, etc), please take photos and send a message through Mychart.  Your strep test was negative, so let's  hold off on antibiotics at this time. If you develop higher fevers, increased swelling, white spots, swollen glands, let us know and we can treat presumptively for a bacterial pharyngitis. Given your son's frequent infections/antibiotic use, I would avoid amoxicillin, and treat with a Z-pak. Let us know if symptoms persist or worsen.  I hope you both feel better soon!    Follow Up Instructions:    I discussed the assessment and treatment plan with the patient. The patient was provided an opportunity to ask questions and all were answered. The patient agreed with the plan and demonstrated an understanding of the instructions.   The patient was advised to call back or seek an in-person evaluation if the symptoms worsen or if the condition fails to improve as anticipated.  I spent 20 minutes dedicated to the care of this patient, including pre-visit review of records, face to face time, post-visit ordering of testing and documentation.    Lavonda Jumbo, MD

## 2021-01-29 NOTE — Patient Instructions (Signed)
We discussed the various causes of sore throat--there are many viruses that can cause this. Treatment would just be supportive--treating symptoms. Salt water gargles, tylenol, ibuprofen. If you notice any changes in the appearance of your throat (ulcers, white spots, etc), please take photos and send a message through Mychart.  Your strep test was negative, so let's hold off on antibiotics at this time. If you develop higher fevers, increased swelling, white spots, swollen glands, let us know and we can treat presumptively for a bacterial pharyngitis. Given your son's frequent infections/antibiotic use, I would avoid amoxicillin, and treat with a Z-pak. Let us know if symptoms persist or worsen.  I hope you both feel better soon!

## 2021-04-07 ENCOUNTER — Ambulatory Visit (INDEPENDENT_AMBULATORY_CARE_PROVIDER_SITE_OTHER): Payer: 59 | Admitting: Medical

## 2021-04-07 ENCOUNTER — Other Ambulatory Visit: Payer: Self-pay

## 2021-04-07 VITALS — BP 104/64 | HR 73 | Temp 97.4°F | Wt 208.8 lb

## 2021-04-07 DIAGNOSIS — H6982 Other specified disorders of Eustachian tube, left ear: Secondary | ICD-10-CM

## 2021-04-07 DIAGNOSIS — H9202 Otalgia, left ear: Secondary | ICD-10-CM | POA: Diagnosis not present

## 2021-04-07 DIAGNOSIS — J3489 Other specified disorders of nose and nasal sinuses: Secondary | ICD-10-CM | POA: Diagnosis not present

## 2021-04-07 MED ORDER — AMOXICILLIN 875 MG PO TABS: 875.0000 mg | ORAL_TABLET | Freq: Two times a day (BID) | ORAL | 0 refills | Status: AC

## 2021-04-07 NOTE — Progress Notes (Signed)
Subjective:  Catherine Hill is a 36 y.o. female who presents for Chief Complaint  Patient presents with   Nasal Congestion    Since 03/24/21    Ear Fullness    Left and has pain since this past Wednesday    Here for ear pain.  She started with a cold 2 weeks ago.  Last 5 days has had left ear pain and pressure.  Has sinus pressure on the left, cannot breathe out of the left nostril.  Has some teeth ache.  No fever, no nausea or vomiting, but balance does feel little off.  Has a mild dry cough.  No fever body aches or chills.    She has a 31-month-old son in daycare and they have gotten everybody that he has had these last several months.  He ended up having to have ear tubes.  He also had COVID and RSV.  She is not currently breast-feeding.  No other aggravating or relieving factors.    No other c/o.  The following portions of the patient's history were reviewed and updated as appropriate: allergies, current medications, past family history, past medical history, past social history, past surgical history and problem list.  ROS Otherwise as in subjective above  Objective: BP 104/64    Pulse 73    Temp (!) 97.4 F (36.3 C)    Wt 208 lb 12.8 oz (94.7 kg)    SpO2 99%    BMI 34.22 kg/m   General appearance: alert, no distress, well developed, well nourished HEENT: normocephalic, sclerae anicteric, conjunctiva pink and moist, right TM pearly, left TM with some mucoid coloration behind the TM, nares with turbinate edema worse on the left, erythema present,, pharynx normal Oral cavity: MMM, no lesions Neck: supple, no lymphadenopathy, no thyromegaly, no masses Lungs: CTA bilaterally, no wheezes, rhonchi, or rales    Assessment: Encounter Diagnoses  Name Primary?   Eustachian tube dysfunction, left Yes   Sinus pressure    Ear pain, left      Plan: We discussed symptoms and exam findings.  Begin amoxicillin below.  Discussed the following recommendations  Patient Instructions   Recommendations: Continue good hydration throughout the day Consider nasal saline flush and salt water gargles Continue sudafed a few more days Consider short term Afrin nasal spray to help open up the nostrils, once or twice daily for  3 days or less.  Continue allergy pill daily through allergy season Begin Amoxicillin twice daily for 10 days  If not much improved by this Friday, call back    [name] was seen today for nasal congestion and ear fullness.  Diagnoses and all orders for this visit:  Eustachian tube dysfunction, left  Sinus pressure  Ear pain, left  Other orders -     amoxicillin (AMOXIL) 875 MG tablet; Take 1 tablet (875 mg total) by mouth 2 (two) times daily for 10 days.    Follow up: As needed

## 2021-04-07 NOTE — Patient Instructions (Signed)
Recommendations: Continue good hydration throughout the day Consider nasal saline flush and salt water gargles Continue sudafed a few more days Consider short term Afrin nasal spray to help open up the nostrils, once or twice daily for  3 days or less.  Continue allergy pill daily through allergy season Begin Amoxicillin twice daily for 10 days  If not much improved by this Friday, call back

## 2021-07-08 ENCOUNTER — Telehealth (INDEPENDENT_AMBULATORY_CARE_PROVIDER_SITE_OTHER): Payer: 59 | Admitting: Physician Assistant

## 2021-07-08 ENCOUNTER — Encounter: Payer: Self-pay | Admitting: Physician Assistant

## 2021-07-08 VITALS — Temp 100.4°F | Wt 208.0 lb

## 2021-07-08 DIAGNOSIS — J028 Acute pharyngitis due to other specified organisms: Secondary | ICD-10-CM

## 2021-07-08 MED ORDER — AMOXICILLIN 875 MG PO TABS: 875.0000 mg | ORAL_TABLET | Freq: Two times a day (BID) | ORAL | 0 refills | Status: AC

## 2021-07-08 NOTE — Progress Notes (Signed)
Start time: 9:17 am End time: 9:35 am  Virtual Visit via Video Note   Patient ID: Catherine Hill, female    DOB: 04-Dec-1985, 36 y.o.   MRN: [id number]  I connected with above patient on 07/08/21 by a video enabled telemedicine application and verified that I am speaking with the correct person using two identifiers.  Location: Patient: home Provider: office   I discussed the limitations of evaluation and management by telemedicine and the availability of in person appointments. The patient expressed understanding and agreed to proceed.  History of Present Illness:  Chief Complaint  Patient presents with   Acute Visit    Virtual- Son tested positive for strep last week. He symptoms started over the weekend. Fever, Headaches, sore throat. Lymph nodes feel swollen.    Reports a 5 day history of a sore throat, is able to swallow food and fluids, denies cough, denies cough, denies sneezing, runny nose, or post-nasal drip, does have allergic rhinitis, but it's not bothering her right now, Son was diagnosed with strep throat last week and patient has been around other children and adults who were diagnosed with strep throat, had a temperature of 100 - 101 that is now better, has a decreased appetite,  denies nausea/vomiting, diarrhea/constipation, denies recent travel   Observations/Objective:  Temp (!) 100.4 F (38 C)   Wt 208 lb (94.3 kg)   BMI 34.09 kg/m    Assessment: Encounter Diagnosis  Name Primary?   Pharyngitis due to other organism Yes     Plan: Amoxicillin 875 mg bid x 10 days Increase rest and liquids, OTC Tylenol (generic is acetamenophen), Advil or Motrin (generic is ibuprofen) ALWAYS TAKE WITH FOOD, Aleve (generic is naprosyn sodium) ALWAYS TAKE WITH FOOD, warm salt water gargle, OTC throat spray, cough drops as needed.   Letitia was seen today for acute visit.  Diagnoses and all orders for this visit:  Pharyngitis due to other organism  Other orders -      amoxicillin (AMOXIL) 875 MG tablet; Take 1 tablet (875 mg total) by mouth 2 (two) times daily for 10 days.    Follow up: call for a follow up appointment   I discussed the assessment and treatment plan with the patient. The patient was provided an opportunity to ask questions and all were answered. The patient agreed with the plan and demonstrated an understanding of the instructions.   The patient was advised to call back or seek an in-person evaluation if the symptoms worsen or if the condition fails to improve as anticipated. For emergencies go to Urgent Care or the Emergency Department for immediate evaluation.   I spent 15 minutes dedicated to the care of this patient, including pre-visit review of records, face to face time, post-visit ordering of testing and documentation.    Jake Shark, PA-C

## 2021-07-08 NOTE — Patient Instructions (Signed)
Increase rest and liquids, OTC Tylenol (generic is acetamenophen), Advil or Motrin (generic is ibuprofen) ALWAYS TAKE WITH FOOD, Aleve (generic is naprosyn sodium) ALWAYS TAKE WITH FOOD, warm salt water gargle, OTC throat spray, cough drops as needed.  

## 2021-07-10 ENCOUNTER — Telehealth: Payer: Self-pay

## 2021-07-10 NOTE — Telephone Encounter (Signed)
Did fever improve?  If so, continue the prescribed antibiotic. Mono is a viral illness, not treated with antibiotics.  If persistent high fever, should have in-person evaluation (since hers was a virtual visit)

## 2021-07-10 NOTE — Telephone Encounter (Signed)
Advise yes--the amoxicillin is the treatment for strep throat.  Complete the course, and follow-up in office next week if her symptoms haven't resolved.  (To be seen sooner if recurrent fever or other worsening symptoms).

## 2021-07-10 NOTE — Telephone Encounter (Signed)
Pt had a virtual with Catherine Hill on Tuesday and was prescribed amoxicillin. She is still having the same symptoms which is St, headache, swollen lymph nodes and exhaustion. She stated that she had Mono a few years ago and is wondering if that's what it may be. Would you rather me schedule another appt with you or will she just need a new antibiotic?

## 2021-10-15 ENCOUNTER — Encounter: Payer: Self-pay | Admitting: Internal Medicine

## 2021-11-10 ENCOUNTER — Ambulatory Visit (INDEPENDENT_AMBULATORY_CARE_PROVIDER_SITE_OTHER): Payer: 59 | Admitting: Family Medicine

## 2021-11-10 ENCOUNTER — Encounter: Payer: Self-pay | Admitting: Family Medicine

## 2021-11-10 VITALS — BP 118/72 | HR 68 | Ht 65.5 in | Wt 206.2 lb

## 2021-11-10 DIAGNOSIS — R635 Abnormal weight gain: Secondary | ICD-10-CM

## 2021-11-10 DIAGNOSIS — E7800 Pure hypercholesterolemia, unspecified: Secondary | ICD-10-CM | POA: Diagnosis not present

## 2021-11-10 DIAGNOSIS — R5383 Other fatigue: Secondary | ICD-10-CM

## 2021-11-10 DIAGNOSIS — E559 Vitamin D deficiency, unspecified: Secondary | ICD-10-CM

## 2021-11-10 DIAGNOSIS — F411 Generalized anxiety disorder: Secondary | ICD-10-CM | POA: Diagnosis not present

## 2021-11-10 DIAGNOSIS — Z23 Encounter for immunization: Secondary | ICD-10-CM

## 2021-11-10 MED ORDER — ESCITALOPRAM OXALATE 10 MG PO TABS: 10.0000 mg | ORAL_TABLET | Freq: Every day | ORAL | 1 refills | Status: DC

## 2021-11-10 MED ORDER — ALPRAZOLAM 0.25 MG PO TABS: 0.2500 mg | ORAL_TABLET | Freq: Three times a day (TID) | ORAL | 0 refills | Status: DC | PRN

## 2021-11-10 NOTE — Progress Notes (Signed)
Chief Complaint  Patient presents with   Anxiety    Increasing anxiety and weight gain. Struggling with losing weight.    Patient presents with complaints of trouble losing weight, and anxiety.  She reports that she lost 16# down to just under 200# at the beginning of the year. Weight has been going up over the last 3 months, and can't get it off. "I just feel like something is off"--unsure if related to anxiety and stress. Denies stress eating. She admits she is more focused on what her son eats, and thinks she actually isn't eating enough.  Exercise--4-5x/week exercise bike ([employer] and [employer] on demand workouts--strength and cardio, plus walking when working from home.)  Typical diet--lots of eggs (14/week), chicken, tuna (with mayo and/or dijon mustard, greek yogurt). Not much cheese (occasional has some as a snack). Air fryer--no other fried foods, no fast foods.  Not much fruit (2 servings of berries daily). Some quinoa, oatmeal with flax/hemp seeds and banana. Uses almondmilk. Lots of bell peppers and spinach. She skips meals (either lunch or breakfast). Eats a big dinner. Snacks frequently--granola bars, fruit, cheese, eating these snacks in place of meal. Drinks protein drinks (110 calories, plant-based protein) 1 daily, either as a snack or in place of a meal. Also eats Joline Salt protein bars (instead of a meal). Drinks water.  Alcohol 2-3x/month (2 drinks).  When she was "smallest" she ate a lot of protein, so has been trying to incorporate more protein in her diet.  Anxiety--has always struggled with this, was able to manage it prior to having her child. Managed with exercise, time for herself. No longer has as much time. Very anxious in a car (convinced she is going to die).  Very worried about upcoming camping trip.  She isn't able to relax and enjoy anything.  Wants to be able to enjoy the holidays with her son. Tried accupuncture, which helped. Hasn't had counseling.   Interested in possibly starting medication.    PMH, PSH, SH reviewed Sister takes wellbutrin for depression.  Outpatient Encounter Medications as of 11/10/2021  Medication Sig Note   Prenatal Vit-Fe Fumarate-FA (PRENATAL MULTIVITAMIN) TABS tablet Take 1 tablet by mouth daily at 12 noon.    zinc gluconate 50 MG tablet Take 50 mg by mouth daily.    cetirizine (ZYRTEC) 10 MG tablet Take 10 mg by mouth daily. (Patient not taking: Reported on 11/10/2021) 01/29/2021: As needed   [DISCONTINUED] folic acid (FOLVITE) A999333 MCG tablet Take 400 mcg by mouth daily.    [DISCONTINUED] ibuprofen (ADVIL) 600 MG tablet Take 1 tablet (600 mg total) by mouth every 6 (six) hours. 01/29/2021: Last dose 4pm yesterday   No facility-administered encounter medications on file as of 11/10/2021.   Allergies  Allergen Reactions   Cholestatin Other (See Comments)    ROS: no fever, chills, URI symptoms, headaches, dizziness, chest pain, shortness of breath. Denies changes to hair, only slightly brittle nails.  More fatigue in the last few months. Menses are regular. Sleep varies--sometimes sleeps well, sometimes wakes up and "mind goes a million places".  Not getting woken up by her son. Hands/feet always cold, unchanged (whole life). Bowels aren't as regular, more IBS--some cramping and diarrhea. Denies abdominal pain, no blood in the stool. Denies depression. Anxiety per HPI See HPI.    PHYSICAL EXAM:  BP 118/72   Pulse 68   Ht 5' 5.5" (1.664 m)   Wt 206 lb 3.2 oz (93.5 kg)   LMP 10/24/2021 (Exact Date)  Breastfeeding No   BMI 33.79 kg/m   Wt Readings from Last 3 Encounters:  11/10/21 206 lb 3.2 oz (93.5 kg)  07/08/21 208 lb (94.3 kg)  04/07/21 208 lb 12.8 oz (94.7 kg)  Had baby 05/2020 Prior weight--01/2018 179#  Well-appearing, pleasant female, in no distress HEENT: conjunctiva and sclera are clear, EOMI. OP clear Neck: no lymphadenopathy, thyromegaly or mass Heart: regular rate and  rhythm Lungs: clear bilaterally Back: no spinal or CVA tenderness Abdomen: soft, nontender, no organomegaly or mass Extremities: no edema, normal pulses Psych: somewhat anxious mood. Normal eye contact, speech, hygiene and grooming Neuro: alert and oriented, grossly normal cranial nerves, normal gait.      11/10/2021    9:11 AM  GAD 7 : Generalized Anxiety Score  Nervous, Anxious, on Edge 1  Control/stop worrying 3  Worry too much - different things 3  Trouble relaxing 1  Restless 0  Easily annoyed or irritable 1  Afraid - awful might happen 1  Total GAD 7 Score 10  Anxiety Difficulty Somewhat difficult   ASSESSMENT/PLAN:  Generalized anxiety disorder - Unable to manage on her own since having baby.  Encouraged counseling. Disc SE and how to take SSRI, xanax prn, sparingly. - Plan: escitalopram (LEXAPRO) 10 MG tablet, ALPRAZolam (XANAX) 0.25 MG tablet  Weight gain - counseled re: diet, exercise in detail.  To cut back on egg yolks, cont increased vegetables, disc plant-bases proteins, portions, how stress affects  Vitamin D deficiency - Plan: VITAMIN D 25 Hydroxy (Vit-D Deficiency, Fractures)  Pure hypercholesterolemia - She believes has been up in the past. Discussed lowfat, low cholesterol diet. - Plan: Lipid panel  Fatigue, unspecified type - check labs, likely related to stress and intermittently poor sleep - Plan: Comprehensive metabolic panel, CBC with Differential/Platelet, VITAMIN D 25 Hydroxy (Vit-D Deficiency, Fractures), TSH  Need for influenza vaccination - Plan: Flu Vaccine QUAD 6+ mos PF IM (Fluarix Quad PF)  Counseled in detail re: diet, exercise, portions. Discussed whole foods, limiting processed foods, encouraged more plant-based protein, cut back on egg yolks, mayo.  Counseled re: risks/SE of SSRI and xanax. Encouraged counseling. Will consider SSRI, may not start right away.  To f/u 6 weeks after starting.  I spent 60 minutes dedicated to the care of this  patient, including pre-visit review of records, face to face time, post-visit ordering of testing and documentation.

## 2021-11-10 NOTE — Patient Instructions (Addendum)
Cut back on eggs (max 5 yolks/week), fine to eat plenty of egg whiltes. Avoid skipping meals, eat plenty of whole foods.  I encourage you to reach out for counseling. If you start the lexapro--take only 1/2 tablet daily, increase to the full pill after a week (or longer, if needed). If you have terrible anxiety on the 1/2 tablet, you can cut it in quarters. Ideally you should get up to the full tablet to help with anxiety. Use the alprazolam (xanax) sparingly, only if needed for severe anxiety, which can sometimes happen when you first start the medication.

## 2021-11-11 DIAGNOSIS — E559 Vitamin D deficiency, unspecified: Secondary | ICD-10-CM | POA: Insufficient documentation

## 2021-11-11 LAB — CBC WITH DIFFERENTIAL/PLATELET
Basophils Absolute: 0.1 10*3/uL (ref 0.0–0.2)
Basos: 1 %
EOS (ABSOLUTE): 0 10*3/uL (ref 0.0–0.4)
Eos: 0 %
Hematocrit: 40 % (ref 34.0–46.6)
Hemoglobin: 13.2 g/dL (ref 11.1–15.9)
Immature Grans (Abs): 0 10*3/uL (ref 0.0–0.1)
Immature Granulocytes: 0 %
Lymphocytes Absolute: 1.8 10*3/uL (ref 0.7–3.1)
Lymphs: 32 %
MCH: 30.1 pg (ref 26.6–33.0)
MCHC: 33 g/dL (ref 31.5–35.7)
MCV: 91 fL (ref 79–97)
Monocytes Absolute: 0.4 10*3/uL (ref 0.1–0.9)
Monocytes: 7 %
Neutrophils Absolute: 3.3 10*3/uL (ref 1.4–7.0)
Neutrophils: 60 %
Platelets: 262 10*3/uL (ref 150–450)
RBC: 4.39 x10E6/uL (ref 3.77–5.28)
RDW: 11.8 % (ref 11.7–15.4)
WBC: 5.6 10*3/uL (ref 3.4–10.8)

## 2021-11-11 LAB — COMPREHENSIVE METABOLIC PANEL
ALT: 19 IU/L (ref 0–32)
AST: 18 IU/L (ref 0–40)
Albumin/Globulin Ratio: 1.6 (ref 1.2–2.2)
Albumin: 4.7 g/dL (ref 3.9–4.9)
Alkaline Phosphatase: 102 IU/L (ref 44–121)
BUN/Creatinine Ratio: 18 (ref 9–23)
BUN: 12 mg/dL (ref 6–20)
Bilirubin Total: 0.5 mg/dL (ref 0.0–1.2)
CO2: 21 mmol/L (ref 20–29)
Calcium: 9.4 mg/dL (ref 8.7–10.2)
Chloride: 102 mmol/L (ref 96–106)
Creatinine, Ser: 0.65 mg/dL (ref 0.57–1.00)
Globulin, Total: 3 g/dL (ref 1.5–4.5)
Glucose: 84 mg/dL (ref 70–99)
Potassium: 4.1 mmol/L (ref 3.5–5.2)
Sodium: 136 mmol/L (ref 134–144)
Total Protein: 7.7 g/dL (ref 6.0–8.5)
eGFR: 117 mL/min/{1.73_m2} (ref >59)

## 2021-11-11 LAB — TSH: TSH: 1.27 u[IU]/mL (ref 0.450–4.500)

## 2021-11-11 LAB — LIPID PANEL
Chol/HDL Ratio: 3.5 ratio (ref 0.0–4.4)
Cholesterol, Total: 191 mg/dL (ref 100–199)
HDL: 55 mg/dL (ref >39)
LDL Chol Calc (NIH): 121 mg/dL — ABNORMAL HIGH (ref 0–99)
Triglycerides: 84 mg/dL (ref 0–149)
VLDL Cholesterol Cal: 15 mg/dL (ref 5–40)

## 2021-11-11 LAB — VITAMIN D 25 HYDROXY (VIT D DEFICIENCY, FRACTURES): Vit D, 25-Hydroxy: 23.5 ng/mL — ABNORMAL LOW (ref 30.0–100.0)

## 2021-11-11 MED ORDER — VITAMIN D (ERGOCALCIFEROL) 1.25 MG (50000 UNIT) PO CAPS: 50000.0000 [IU] | ORAL_CAPSULE | ORAL | 0 refills | Status: DC

## 2021-11-11 NOTE — Addendum Note (Signed)
Addended by: Rita Ohara on: 11/11/2021 08:29 AM   Modules accepted: Orders

## 2021-11-18 ENCOUNTER — Encounter: Payer: Self-pay | Admitting: Internal Medicine

## 2021-12-04 ENCOUNTER — Other Ambulatory Visit: Payer: Self-pay | Admitting: Family Medicine

## 2021-12-04 DIAGNOSIS — F411 Generalized anxiety disorder: Secondary | ICD-10-CM

## 2021-12-25 ENCOUNTER — Encounter: Payer: 59 | Admitting: Family Medicine

## 2022-01-07 ENCOUNTER — Encounter: Payer: Self-pay | Admitting: Family Medicine

## 2022-01-07 NOTE — Patient Instructions (Incomplete)
  HEALTH MAINTENANCE RECOMMENDATIONS:  It is recommended that you get at least 30 minutes of aerobic exercise at least 5 days/week (for weight loss, you may need as much as 60-90 minutes). This can be any activity that gets your heart rate up. This can be divided in 10-15 minute intervals if needed, but try and build up your endurance at least once a week.  Weight bearing exercise is also recommended twice weekly.  Eat a healthy diet with lots of vegetables, fruits and fiber.  "Colorful" foods have a lot of vitamins (ie green vegetables, tomatoes, red peppers, etc).  Limit sweet tea, regular sodas and alcoholic beverages, all of which has a lot of calories and sugar.  Up to 1 alcoholic drink daily may be beneficial for women (unless trying to lose weight, watch sugars).  Drink a lot of water.  Calcium recommendations are 1200-1500 mg daily (1500 mg for postmenopausal women or women without ovaries), and vitamin D 1000 IU daily.  This should be obtained from diet and/or supplements (vitamins), and calcium should not be taken all at once, but in divided doses.  Monthly self breast exams and yearly mammograms for women over the age of 73 is recommended.  Sunscreen of at least SPF 30 should be used on all sun-exposed parts of the skin when outside between the hours of 10 am and 4 pm (not just when at beach or pool, but even with exercise, golf, tennis, and yard work!)  Use a sunscreen that says "broad spectrum" so it covers both UVA and UVB rays, and make sure to reapply every 1-2 hours.  Remember to change the batteries in your smoke detectors when changing your clock times in the spring and fall. Carbon monoxide detectors are recommended for your home.  Use your seat belt every time you are in a car, and please drive safely and not be distracted with cell phones and texting while driving.   Remember to start taking an additional 1000 IU of vitamin D3 when you finish the 12 weeks of the prescription  course.  We discussed the following psychology practices--[facility] (both are [facility]), [facility] (on [address]), [address] (on [place])

## 2022-01-07 NOTE — Progress Notes (Unsigned)
No chief complaint on file.  Catherine Hill is a 36 y.o. female who presents for a complete physical and follow-up on anxiety.  She was seen in early October with complaints of with complaints of trouble losing weight, and anxiety.   Anxiety--has always struggled with this, was able to manage it prior to having her child. Managed with exercise, time for herself. No longer has as much time. In October she reported being very anxious in a car (convinced she is going to die), was very worried about upcoming camping trip. Felt like she wasn't able to relax and enjoy anything.  Wants to be able to enjoy the holidays with her son. She had tried accupuncture, which helped. Hasn't had counseling.  We encouraged medication, and prescribed/discussed lexapro in detail. GAD-7 score was 10 at last visit.  Counseling? Lexapro?  Vitamin D deficiency:  level was noted to be low at 23.5 in 11/2021, when taking a daily prenatal vitamin. She was prescribed weekly Rx vitamin D x 12 weeks, and was advised to take an additional 1000 IU of D3 in addition to her PNV. She is still taking the weekly prescription vitamin D.   At her October visit we reviewed diet and exercise:  Exercise--4-5x/week exercise bike ([employer] and [employer] on demand workouts--strength and cardio, plus walking when working from home.) Typical diet--lots of eggs (14/week), chicken, tuna (with mayo and/or dijon mustard, greek yogurt). Not much cheese (occasional has some as a snack). Air fryer--no other fried foods, no fast foods.  Not much fruit (2 servings of berries daily). Some quinoa, oatmeal with flax/hemp seeds and banana. Uses almondmilk. Lots of bell peppers and spinach. She skips meals (either lunch or breakfast), and eats a big dinner. Snacks frequently--granola bars, fruit, cheese, eating these snacks in place of meal. Drinks protein drinks (110 calories, plant-based protein) 1 daily, either as a snack or in place of a meal. Also  eats Joline Salt protein bars (instead of a meal). Drinks water.  Alcohol 2-3x/month (2 drinks).   She was advised to cut back on egg intake (max 5 yolks/week), to avoid skipping meals, eat plenty of whole foods. Since last visit:   Immunization History  Administered Date(s) Administered   DTaP 09/15/1999   HPV Quadrivalent 09/17/2005, 07/10/2008, 06/27/2009   Influenza Split 12/11/2010   Influenza,inj,Quad PF,6+ Mos 11/12/2020, 11/10/2021   Influenza-Unspecified 10/25/2014, 11/10/2017   Tdap 06/27/2009   Last Pap smear: 11/15/2019 normal, negative HPV Last mammogram: 2007 Last colonoscopy: never Last DEXA: never Dentist: Ophtho: Exercise: 4-5x/week exercise bike [employer] and [employer] on [employer] and cardio, plus walking when working from home.)   PMH, PSH, SH and FH were reviewed and updated   ROS:  Denies fever, headaches,  vision changes, decreased hearing, ear pain, sore throat, breast concerns, chest pain, palpitations, dizziness, syncope, dyspnea on exertion, cough, swelling, nausea, vomiting, diarrhea, constipation, abdominal pain, melena, hematochezia, indigestion/heartburn, hematuria, incontinence, dysuria, irregular menstrual cycles, vaginal discharge, odor or itch, genital lesions, joint pains, numbness, tingling, weakness, tremor, suspicious skin lesions, depression, abnormal bleeding/bruising, or enlarged lymph nodes.  Weight Anxiety   PHYSICAL EXAM:  There were no vitals taken for this visit.  Wt Readings from Last 3 Encounters:  11/10/21 206 lb 3.2 oz (93.5 kg)  07/08/21 208 lb (94.3 kg)  04/07/21 208 lb 12.8 oz (94.7 kg)   General Appearance:    Alert, cooperative, no distress, appears stated age  Head:    Normocephalic, without obvious abnormality, atraumatic  Eyes:  PERRL, conjunctiva/corneas clear, EOM's intact, fundi    benign  Ears:    Normal TM's and external ear canals  Nose:   Nares normal, mucosa normal, no drainage or sinus    tenderness  Throat:   Lips, mucosa, and tongue normal; teeth and gums normal  Neck:   Supple, no lymphadenopathy;  thyroid:  no enlargement/ tenderness/nodules; no carotid bruit or JVD  Back:    Spine nontender, no curvature, ROM normal, no CVA     tenderness  Lungs:     Clear to auscultation bilaterally without wheezes, rales or     ronchi; respirations unlabored  Chest Wall:    No tenderness or deformity   Heart:    Regular rate and rhythm, S1 and S2 normal, no murmur, rub   or gallop  Breast Exam:    Deferred to GYN  Abdomen:     Soft, non-tender, nondistended, normoactive bowel sounds,    no masses, no hepatosplenomegaly  Genitalia:    Deferred to GYN     Extremities:   No clubbing, cyanosis or edema  Pulses:   2+ and symmetric all extremities  Skin:   Skin color, texture, turgor normal, no rashes or lesions  Lymph nodes:   Cervical, supraclavicular, and axillary nodes normal  Neurologic:   CNII-XII intact, normal strength, sensation and gait; reflexes 2+ and symmetric throughout          Psych:   Normal mood, affect, hygiene and grooming.    Labs reviewed from 11/2021-- Normal cbc, c-met, TSH. Vitamin D-OH low at 23.5 Lab Results  Component Value Date   TSH 1.270 11/10/2021   Lab Results  Component Value Date   CHOL 191 11/10/2021   HDL 55 11/10/2021   LDLCALC 121 (H) 11/10/2021   TRIG 84 11/10/2021   CHOLHDL 3.5 11/10/2021     ASSESSMENT/PLAN:  GAD-7  Did she get Tdap from GYN with pregnancy in 2022?--YES, on OB notes in media Tdap 03/28/20, please abstract Abstract pap 11/15/2019 normal, negative HPV (on OB flowsheet in media) Enter COVID vaccines (there was at least 1 on the OB flowsheet) Offer booster, decline if refuses  IS SHE PLANNING TO SEE GYN? We have never done CPE's on her, so I'm assuming that she will continue to see her GYN for breast/pelvic/pap. Let me know if she is NOT planning that, so I will do her full exam.  Discussed monthly self breast exams  and yearly mammograms after the age of 17; at least 30 minutes of aerobic activity at least 5 days/week, weight-bearing exercise at least 2x/week; proper sunscreen use reviewed; healthy diet, including goals of calcium and vitamin D intake and alcohol recommendations (less than or equal to 1 drink/day) reviewed; regular seatbelt use; changing batteries in smoke detectors.  Immunization recommendations discussed-- continue yearly flu shots. COVID booster  Colon cancer screening recommendations reviewed, age 11.  Remember to start taking an additional 1000 IU of vitamin D3 when you finish the 12 weeks of the prescription course.

## 2022-01-08 ENCOUNTER — Ambulatory Visit (INDEPENDENT_AMBULATORY_CARE_PROVIDER_SITE_OTHER): Payer: 59 | Admitting: Family Medicine

## 2022-01-08 ENCOUNTER — Encounter: Payer: Self-pay | Admitting: Family Medicine

## 2022-01-08 ENCOUNTER — Encounter: Payer: Self-pay | Admitting: *Deleted

## 2022-01-08 VITALS — BP 112/68 | HR 64 | Ht 64.25 in | Wt 204.2 lb

## 2022-01-08 DIAGNOSIS — Z0000 Encounter for general adult medical examination without abnormal findings: Secondary | ICD-10-CM | POA: Diagnosis not present

## 2022-01-08 DIAGNOSIS — F411 Generalized anxiety disorder: Secondary | ICD-10-CM | POA: Diagnosis not present

## 2022-01-08 DIAGNOSIS — Z6834 Body mass index (BMI) 34.0-34.9, adult: Secondary | ICD-10-CM

## 2022-01-08 DIAGNOSIS — E559 Vitamin D deficiency, unspecified: Secondary | ICD-10-CM | POA: Diagnosis not present

## 2022-01-08 LAB — POCT URINALYSIS DIP (PROADVANTAGE DEVICE)
Bilirubin, UA: NEGATIVE
Blood, UA: NEGATIVE
Glucose, UA: NEGATIVE mg/dL
Leukocytes, UA: NEGATIVE
Nitrite, UA: NEGATIVE
Protein Ur, POC: NEGATIVE mg/dL
Specific Gravity, Urine: 1.01
Urobilinogen, Ur: 0.2
pH, UA: 6 (ref 5.0–8.0)

## 2022-06-10 ENCOUNTER — Encounter: Payer: Self-pay | Admitting: Family Medicine

## 2022-06-16 NOTE — Progress Notes (Unsigned)
No chief complaint on file.  Seeing Catherine Hill, nutritionist.  She is recommending use of a CGM for 1 month, for education and accountability purposes. Pt is aware that insurance won't cover, can be $80. Ms Catherine Hill is also asking orders for "pre-" labs (CBC, metabolic panel, lipid panel, HbA1c, vitamin D and blood pressure), and then to return in 3 months to check their progress.  Patient presents today to discuss these labs, get baseline BP, and for CGM.  Per October visit-- Exercise--4-5x/week exercise bike ([employer] and [employer] on demand workouts--strength and cardio, plus walking when working from home.) Typical diet--lots of eggs (14/week), chicken, tuna (with mayo and/or dijon mustard, greek yogurt). Not much cheese (occasional has some as a snack). Air fryer--no other fried foods, no fast foods.  Not much fruit (2 servings of berries daily). Some quinoa, oatmeal with flax/hemp seeds and banana. Uses almondmilk. Lots of bell peppers and spinach. At that time she reported skipping meals (either lunch or breakfast), and eats a big dinner. Snacked frequently--granola bars, fruit, cheese, eating these snacks in place of meal. Or having protein drinks (110 calories, plant-based protein) 1 daily, or [place] protein bars, either as a snack or in place of a meal.   At her CPE she reported cutting back on eggs (1 egg with egg white, only 2 yolks/week), eating leftovers from dinner for breakfast rather than skiping meals.  Eating more beans and squash.  Dietary changes made so far    PMH, PSH, SH reviewed    ROS:   PHYSICAL EXAM:  There were no vitals taken for this visit.  Wt Readings from Last 3 Encounters:  01/08/22 204 lb 3.2 oz (92.6 kg)  11/10/21 206 lb 3.2 oz (93.5 kg)  07/08/21 208 lb (94.3 kg)      ASSESSMENT/PLAN:   CBC, metabolic panel, lipid panel, HbA1c, vitamin D

## 2022-06-17 ENCOUNTER — Encounter: Payer: Self-pay | Admitting: Family Medicine

## 2022-06-17 ENCOUNTER — Ambulatory Visit (INDEPENDENT_AMBULATORY_CARE_PROVIDER_SITE_OTHER): Payer: 59 | Admitting: Family Medicine

## 2022-06-17 VITALS — BP 108/60 | HR 68 | Ht 64.5 in | Wt 206.4 lb

## 2022-06-17 DIAGNOSIS — R5383 Other fatigue: Secondary | ICD-10-CM | POA: Diagnosis not present

## 2022-06-17 DIAGNOSIS — D229 Melanocytic nevi, unspecified: Secondary | ICD-10-CM

## 2022-06-17 DIAGNOSIS — E669 Obesity, unspecified: Secondary | ICD-10-CM | POA: Diagnosis not present

## 2022-06-17 DIAGNOSIS — E559 Vitamin D deficiency, unspecified: Secondary | ICD-10-CM | POA: Diagnosis not present

## 2022-06-17 DIAGNOSIS — E785 Hyperlipidemia, unspecified: Secondary | ICD-10-CM

## 2022-06-17 DIAGNOSIS — L608 Other nail disorders: Secondary | ICD-10-CM

## 2022-06-17 NOTE — Patient Instructions (Signed)
Use an antibacterial ointment to the lesion on the chest  use a bandage, if needed to prevent from picking at it or scratching it. If it doesn't resolve, see your dermatologist.

## 2022-06-18 LAB — CBC WITH DIFFERENTIAL/PLATELET
Basophils Absolute: 0.1 10*3/uL (ref 0.0–0.2)
Basos: 1 %
EOS (ABSOLUTE): 0 10*3/uL (ref 0.0–0.4)
Eos: 1 %
Hematocrit: 38 % (ref 34.0–46.6)
Hemoglobin: 12.2 g/dL (ref 11.1–15.9)
Immature Grans (Abs): 0 10*3/uL (ref 0.0–0.1)
Immature Granulocytes: 0 %
Lymphocytes Absolute: 1.6 10*3/uL (ref 0.7–3.1)
Lymphs: 29 %
MCH: 30.1 pg (ref 26.6–33.0)
MCHC: 32.1 g/dL (ref 31.5–35.7)
MCV: 94 fL (ref 79–97)
Monocytes Absolute: 0.4 10*3/uL (ref 0.1–0.9)
Monocytes: 8 %
Neutrophils Absolute: 3.4 10*3/uL (ref 1.4–7.0)
Neutrophils: 61 %
Platelets: 262 10*3/uL (ref 150–450)
RBC: 4.05 x10E6/uL (ref 3.77–5.28)
RDW: 11.7 % (ref 11.7–15.4)
WBC: 5.6 10*3/uL (ref 3.4–10.8)

## 2022-06-18 LAB — COMPREHENSIVE METABOLIC PANEL
ALT: 22 IU/L (ref 0–32)
AST: 20 IU/L (ref 0–40)
Albumin/Globulin Ratio: 1.5 (ref 1.2–2.2)
Albumin: 4.3 g/dL (ref 3.9–4.9)
Alkaline Phosphatase: 98 IU/L (ref 44–121)
BUN/Creatinine Ratio: 21 (ref 9–23)
BUN: 13 mg/dL (ref 6–20)
Bilirubin Total: 0.4 mg/dL (ref 0.0–1.2)
CO2: 20 mmol/L (ref 20–29)
Calcium: 9.3 mg/dL (ref 8.7–10.2)
Chloride: 102 mmol/L (ref 96–106)
Creatinine, Ser: 0.62 mg/dL (ref 0.57–1.00)
Globulin, Total: 2.8 g/dL (ref 1.5–4.5)
Glucose: 84 mg/dL (ref 70–99)
Potassium: 4.2 mmol/L (ref 3.5–5.2)
Sodium: 139 mmol/L (ref 134–144)
Total Protein: 7.1 g/dL (ref 6.0–8.5)
eGFR: 118 mL/min/{1.73_m2} (ref >59)

## 2022-06-18 LAB — LIPID PANEL
Chol/HDL Ratio: 3.3 ratio (ref 0.0–4.4)
Cholesterol, Total: 166 mg/dL (ref 100–199)
HDL: 51 mg/dL (ref >39)
LDL Chol Calc (NIH): 103 mg/dL — ABNORMAL HIGH (ref 0–99)
Triglycerides: 59 mg/dL (ref 0–149)
VLDL Cholesterol Cal: 12 mg/dL (ref 5–40)

## 2022-06-18 LAB — HEMOGLOBIN A1C
Est. average glucose Bld gHb Est-mCnc: 103 mg/dL
Hgb A1c MFr Bld: 5.2 % (ref 4.8–5.6)

## 2022-06-18 LAB — VITAMIN D 25 HYDROXY (VIT D DEFICIENCY, FRACTURES): Vit D, 25-Hydroxy: 67.7 ng/mL (ref 30.0–100.0)

## 2022-07-01 ENCOUNTER — Other Ambulatory Visit: Payer: Self-pay | Admitting: *Deleted

## 2022-07-01 ENCOUNTER — Telehealth: Payer: Self-pay | Admitting: Family Medicine

## 2022-07-01 MED ORDER — FREESTYLE LIBRE 3 SENSOR MISC: 1.0000 | 1 refills | Status: DC

## 2022-07-01 NOTE — Telephone Encounter (Signed)
Patient was given rx. Did not have reader vs sensor or quantity. [facility] preferred electronic rx so I went ahead and sent. Patient aware.

## 2022-07-01 NOTE — Telephone Encounter (Signed)
Pt left message that [facility] having issues with her getting the [facility] [place]

## 2022-09-19 IMAGING — US US MFM FETAL BPP W/O NON-STRESS
1 series · 15 of 28 positions shown · non-contrast
Comparison: none

[Series 1: us mfm fetal bpp w/o non-stress · 34 acquisitions, 15 frames shown]
[im 1/34]
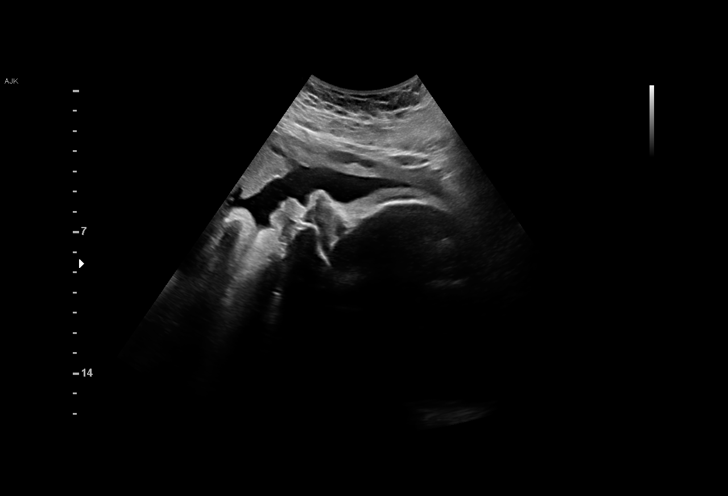
[im 3/34]
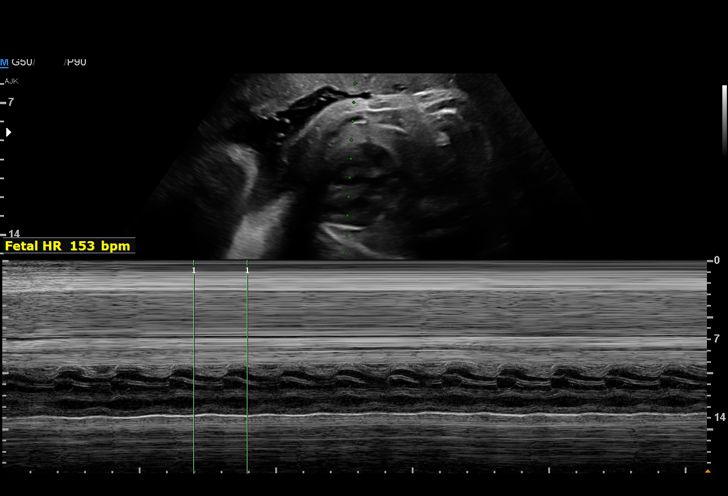
[im 5/34]
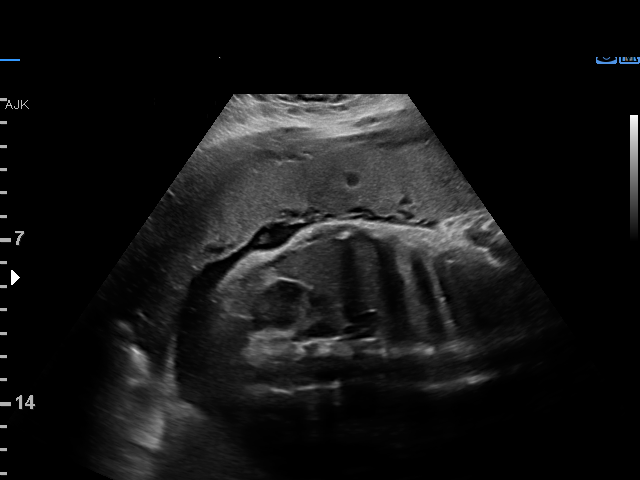
[im 8/34]
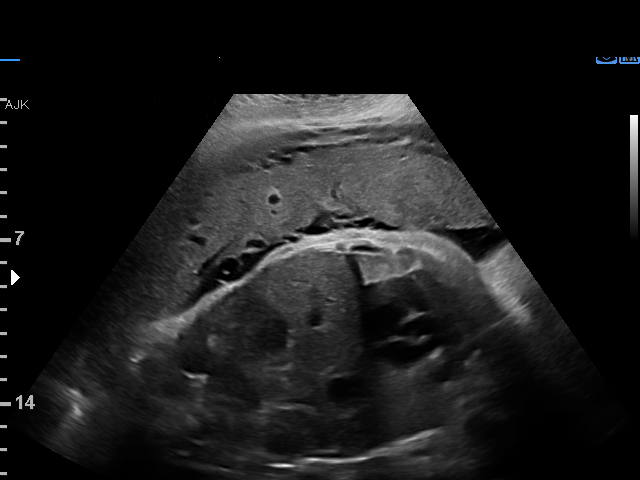
[im 10/34]
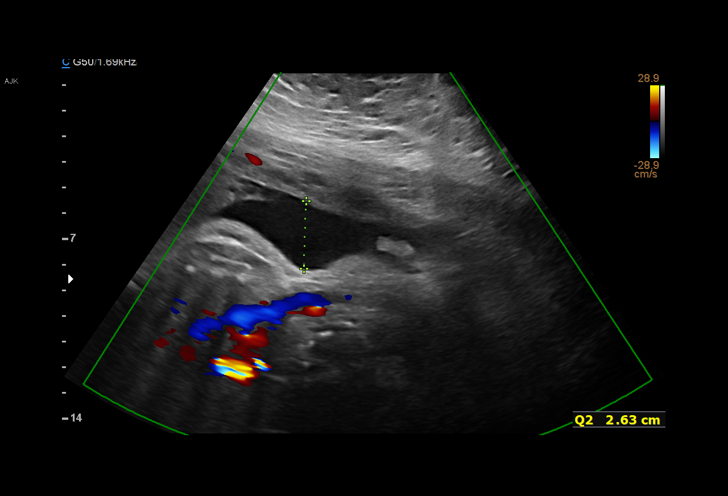
[im 13/34]
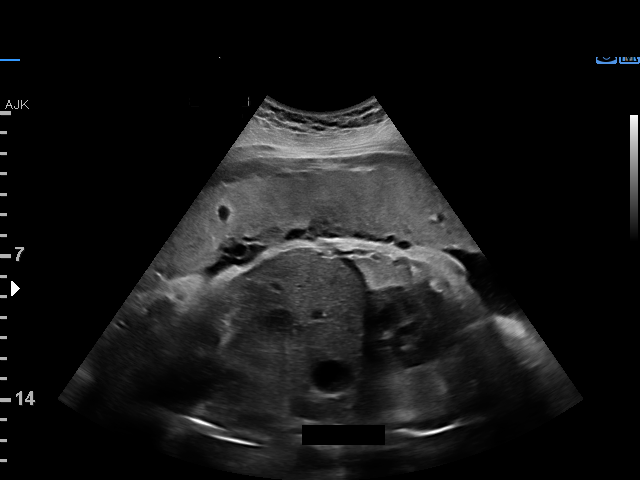
[im 15/34]
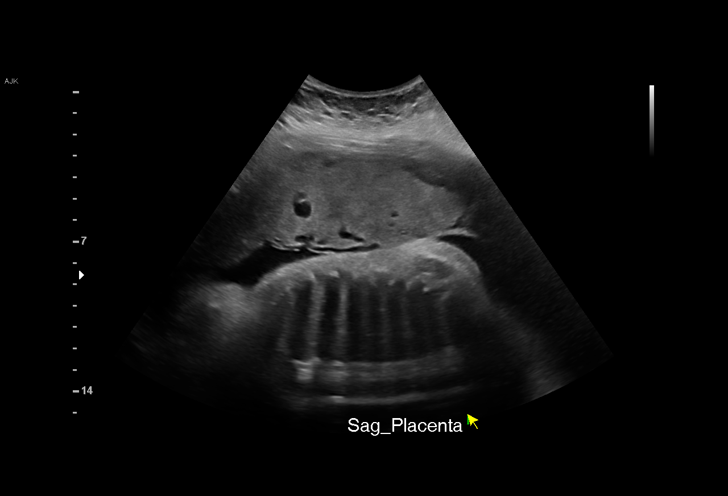
[im 18/34]
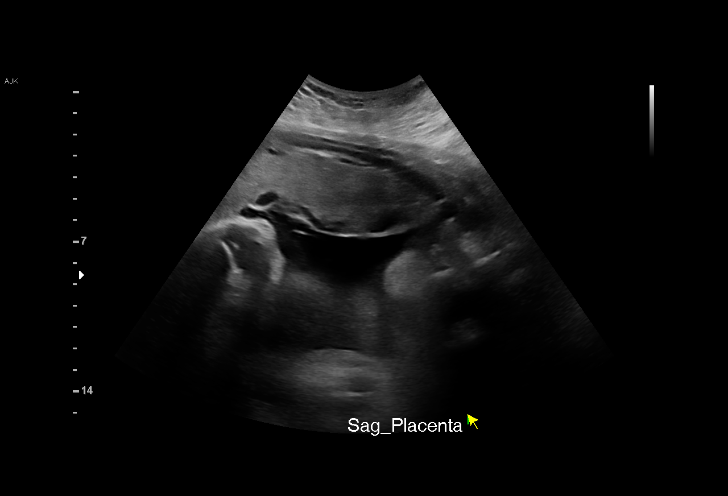
[im 19/34]
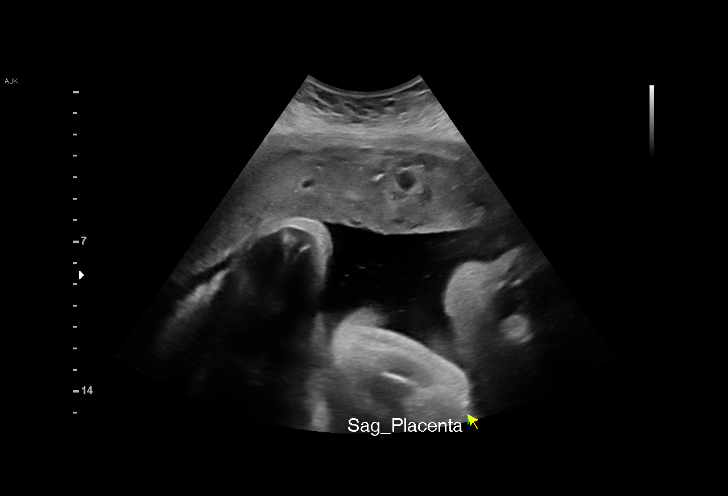
[im 21/34]
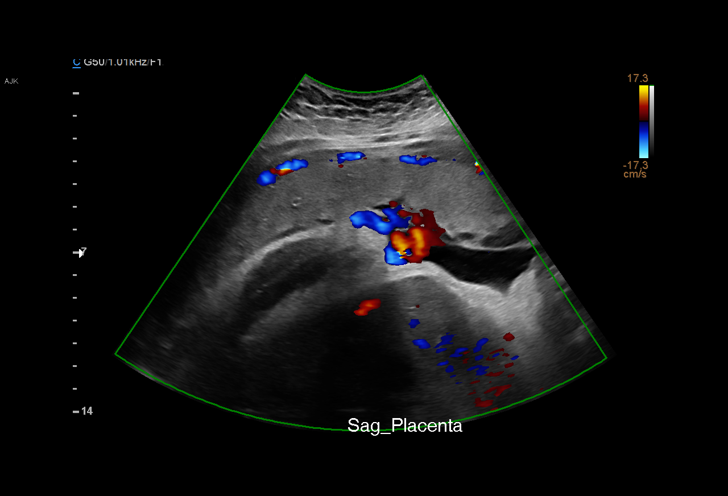
[im 24/34]
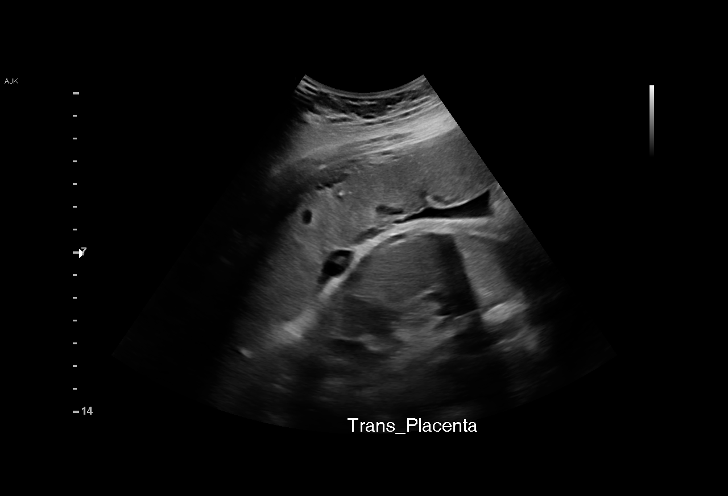
[im 26/34]
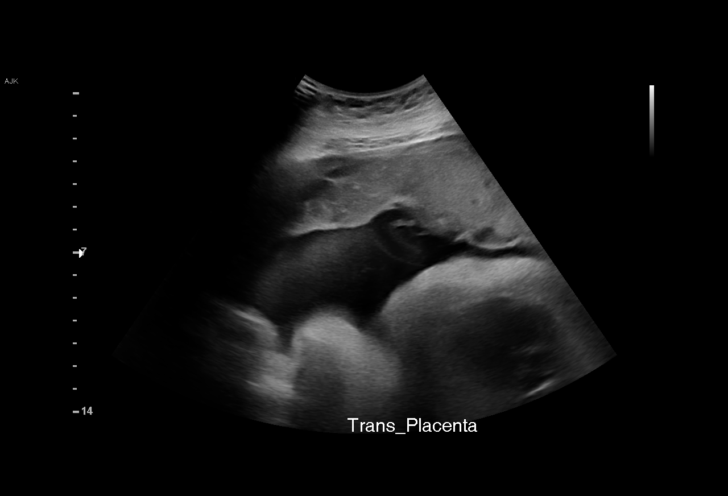
[im 29/34]
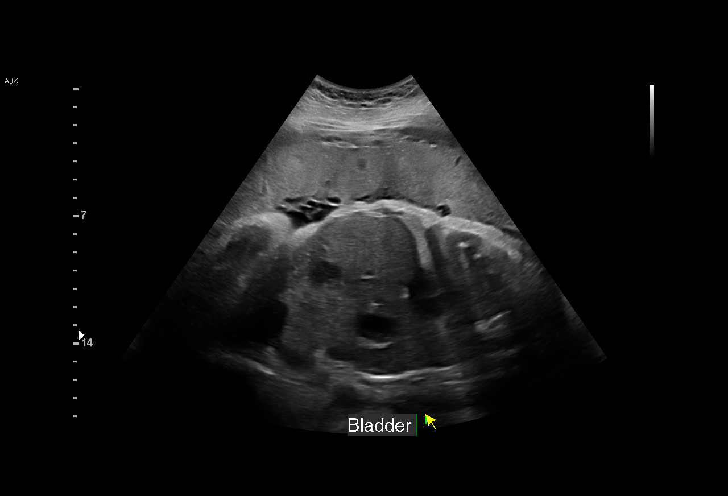
[im 31/34]
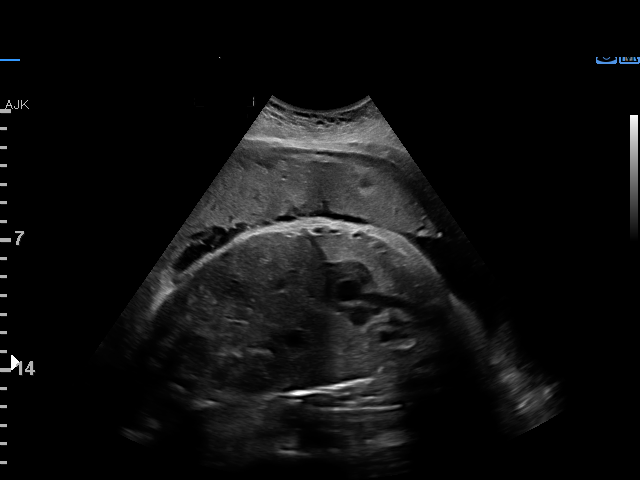
[im 34/34]
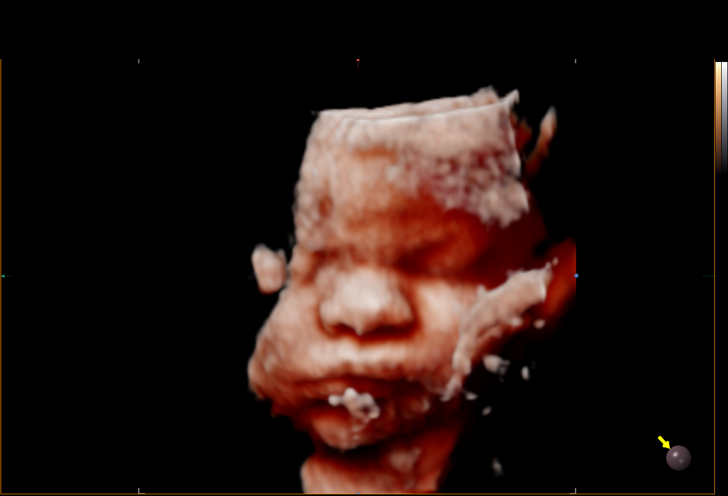

[15 of 28 positions shown; findings below may reference images not displayed]

STRESS                                            ZOGU

Indications

 37 weeks gestation of pregnancy
 Decreased fetal movement
Vital Signs

 BMI:
Fetal Evaluation

 Num Of Fetuses:          1
 Fetal Heart Rate(bpm):   153
 Cardiac Activity:        Observed
 Presentation:            Cephalic
 Placenta:                Anterior
 P. Cord Insertion:       Visualized, central

 Amniotic Fluid
 AFI FV:      Within normal limits

 AFI Sum(cm)     %Tile       Largest Pocket(cm)
 18.5            71

 RUQ(cm)       RLQ(cm)       LUQ(cm)        LLQ(cm)


 Comment:    Stomach, bladder, and diaphragm seen.
Biophysical Evaluation

 Amniotic F.V:   Within normal limits       F. Tone:         Observed
 F. Movement:    Observed                   Score:           [DATE]
 F. Breathing:   Observed
OB History

 Gravidity:    2          SAB:   1
Gestational Age

 Clinical EDD:  37w 1d                                        EDD:   06/13/20
 Best:          37w 1d     Det. By:  Clinical EDD             EDD:   06/13/20
Impression

 Limited exam to assess decreased fetal movement
 Biophysical profile [DATE] with good fetal movement and
 amniotic fluid volume
Recommendations

 Counsel on S[facility] Wali Rangin.

## 2022-11-09 LAB — OB RESULTS CONSOLE GC/CHLAMYDIA
Chlamydia: NEGATIVE
Neisseria Gonorrhea: NEGATIVE

## 2022-11-09 LAB — OB RESULTS CONSOLE VARICELLA ZOSTER ANTIBODY, IGG: Varicella: IMMUNE

## 2022-11-09 LAB — OB RESULTS CONSOLE RUBELLA ANTIBODY, IGM: Rubella: IMMUNE

## 2022-11-09 LAB — OB RESULTS CONSOLE HIV ANTIBODY (ROUTINE TESTING): HIV: NONREACTIVE

## 2022-11-09 LAB — OB RESULTS CONSOLE HEPATITIS B SURFACE ANTIGEN: Hepatitis B Surface Ag: NEGATIVE

## 2023-01-27 NOTE — Progress Notes (Unsigned)
No chief complaint on file.  Catherine Hill is a 37 y.o. female who presents for a complete physical. She is currently pregnant.  She was last seen in May. At that time she reported seeing Lowanda Foster, nutritionist, who recommended use of CGM for a month (for education, accountability purposes), and labs. She had normal A1c, chem panel (including fasting glucose), CBC, Vitamin D and lipids (see below).  Vitamin D deficiency:  Level was normal at 67.7 in May 2024 when taking 1000 IU daily (after completing a prescription course for low level of 23.5 in 11/2021, when she had only been taking PNV daily). She continues to take 1000 IU of D3 daily.  ***UPDATE   Component Ref Range & Units (hover) 7 mo ago 1 yr ago 9 yr ago  Vit D, 25-Hydroxy 67.7 23.5 Low  CM 40 R, CM   History of anxiety--she had been prescribed lexapro at one point, but never started it.  Her anxiety improved with accupuncture, and chinese herbs to balance her hormones. She felt like the vitamin D replacement had helped her energy, and was feeling better.  Therapist?? ***   Immunization History  Administered Date(s) Administered   DTaP 09/15/1999   HPV Quadrivalent 09/17/2005, 07/10/2008, 06/27/2009   Influenza Split 12/11/2010   Influenza,inj,Quad PF,6+ Mos 11/12/2020, 11/10/2021   Influenza-Unspecified 10/25/2014, 11/10/2017, 11/10/2022   PFIZER(Purple Top)SARS-COV-2 Vaccination 04/25/2019, 05/16/2019, 01/13/2020   Pfizer Covid-19 Vaccine Bivalent Booster 30yrs & up 02/15/2021   Pfizer(Comirnaty)Fall Seasonal Vaccine 12 years and older 01/08/2022   Tdap 06/27/2009, 03/28/2020   Last Pap smear: 11/15/2019 normal, negative HPV Last mammogram: 2007 Last colonoscopy: never Last DEXA: never Dentist: twice a year Ophtho: yearly (wears contacts) Exercise:   4-5x/week exercise bike ([place] and [employer] on demand workouts--strength and cardio, plus walking when working from home.)  Lipid screen: Lab Results   Component Value Date   CHOL 166 06/17/2022   HDL 51 06/17/2022   LDLCALC 103 (H) 06/17/2022   TRIG 59 06/17/2022   CHOLHDL 3.3 06/17/2022     PMH, PSH, SH and FH were reviewed and updated     ROS:  Denies fever, headaches,  vision changes, decreased hearing, ear pain, sore throat, breast concerns, chest pain, palpitations, dizziness, syncope, dyspnea on exertion, cough, swelling, nausea, vomiting, diarrhea, constipation, abdominal pain, melena, hematochezia, indigestion/heartburn, hematuria, incontinence, dysuria, irregular menstrual cycles, vaginal discharge, odor or itch, genital lesions, joint pains, numbness, tingling, weakness, tremor, suspicious skin lesions, depression, abnormal bleeding/bruising, or enlarged lymph nodes. Menses --had improved (less heavy, less painful) with chinese herbs she gets from accupuncture. Known endometriosis.  Anxiety    PHYSICAL EXAM:  There were no vitals taken for this visit.  Wt Readings from Last 3 Encounters:  06/17/22 206 lb 6.4 oz (93.6 kg)  01/08/22 204 lb 3.2 oz (92.6 kg)  11/10/21 206 lb 3.2 oz (93.5 kg)   General Appearance:    Alert, cooperative, no distress, appears stated age. She elected not to change into gown--sees dermatologist and GYN.  Head:    Normocephalic, without obvious abnormality, atraumatic  Eyes:    PERRL, conjunctiva/corneas clear, EOM's intact, fundi    benign  Ears:    Normal TM's and external ear canals  Nose:   Nares normal, mucosa normal, no drainage or sinus   tenderness  Throat:   Lips, mucosa, and tongue normal; teeth and gums normal  Neck:   Supple, no lymphadenopathy;  thyroid:  no enlargement/ tenderness/nodules; no carotid bruit or JVD  Back:    Spine nontender, no curvature, ROM normal, no CVA     tenderness  Lungs:     Clear to auscultation bilaterally without wheezes, rales or     ronchi; respirations unlabored  Chest Wall:    No tenderness or deformity   Heart:    Regular rate and rhythm, S1  and S2 normal, no murmur, rub   or gallop  Breast Exam:    Deferred to GYN  Abdomen:     Soft, non-tender, nondistended, normoactive bowel sounds,    no masses, no hepatosplenomegaly  Genitalia:    Deferred to GYN     Extremities:   No clubbing, cyanosis or edema  Pulses:   2+ and symmetric all extremities  Skin:   Skin color, texture, turgor normal, no rashes or lesions. Exam is limited due to not changing into gown.    Lymph nodes:   Cervical, supraclavicular nodes normal  Neurologic:   CNII-XII intact, normal strength, sensation and gait; reflexes 2+ and symmetric throughout          Psych:   Normal mood, affect, hygiene and grooming.         01/08/2022    3:00 PM 11/10/2021    9:11 AM  GAD 7 : Generalized Anxiety Score  Nervous, Anxious, on Edge 1 1  Control/stop worrying 1 3  Worry too much - different things 1 3  Trouble relaxing 3 1  Restless 1 0  Easily annoyed or irritable 1 1  Afraid - awful might happen 0 1  Total GAD 7 Score 8 10  Anxiety Difficulty Not difficult at all Somewhat difficult      01/08/2022    3:00 PM 11/10/2021    9:11 AM 02/20/2016    8:47 AM 01/17/2015    3:28 PM 08/23/2013   11:01 AM  Depression screen PHQ 2/9  Decreased Interest 0 0 0 0 0  Down, Depressed, Hopeless 0 0 0 0 0  PHQ - 2 Score 0 0 0 0 0     ASSESSMENT/PLAN:  GAD-7 if any anxiety Routine phq-2 screen  Any pap since 2021?  If so, please get results  Labs from 06/2022 reviewed, normal.  TSH done the year prior. No labs needed.   To follow up with GYN. Discussed monthly self breast exams and yearly mammograms after the age of 4; at least 30 minutes of aerobic activity at least 5 days/week, weight-bearing exercise at least 2x/week; proper sunscreen use reviewed; healthy diet, including goals of calcium and vitamin D intake and alcohol recommendations (less than or equal to 1 drink/day) reviewed; regular seatbelt use; changing batteries in smoke detectors.  Immunization  recommendations discussed-- continue yearly flu shots. COVID booster given today. Colon cancer screening recommendations reviewed, age 59.  F/u 1 year, sooner prn.

## 2023-01-27 NOTE — Patient Instructions (Incomplete)
  HEALTH MAINTENANCE RECOMMENDATIONS:  It is recommended that you get at least 30 minutes of aerobic exercise at least 5 days/week (for weight loss, you may need as much as 60-90 minutes). This can be any activity that gets your heart rate up. This can be divided in 10-15 minute intervals if needed, but try and build up your endurance at least once a week.  Weight bearing exercise is also recommended twice weekly.  Eat a healthy diet with lots of vegetables, fruits and fiber.  "Colorful" foods have a lot of vitamins (ie green vegetables, tomatoes, red peppers, etc).  Limit sweet tea, regular sodas and alcoholic beverages, all of which has a lot of calories and sugar.  Up to 1 alcoholic drink daily may be beneficial for women (unless trying to lose weight, watch sugars).  Drink a lot of water.  Calcium recommendations are 1200-1500 mg daily (1500 mg for postmenopausal women or women without ovaries), and vitamin D 1000 IU daily.  This should be obtained from diet and/or supplements (vitamins), and calcium should not be taken all at once, but in divided doses.  Monthly self breast exams and yearly mammograms for women over the age of 76 is recommended.  Sunscreen of at least SPF 30 should be used on all sun-exposed parts of the skin when outside between the hours of 10 am and 4 pm (not just when at beach or pool, but even with exercise, golf, tennis, and yard work!)  Use a sunscreen that says "broad spectrum" so it covers both UVA and UVB rays, and make sure to reapply every 1-2 hours.  Remember to change the batteries in your smoke detectors when changing your clock times in the spring and fall. Carbon monoxide detectors are recommended for your home.  Use your seat belt every time you are in a car, and please drive safely and not be distracted with cell phones and texting while driving.  Try sinus rinses once or twice daily as needed to help with head congestion and drain things before they drain  down the throat and contribute to coughing. Consider guaifenesin (in robitussin and plain mucinex--double check with your OB but my sources say this is safe). Sometimes reflux can contribute to cough.  Elevating the head of the bed will help with reflux and postnasal drainage. Use medications for reflux if needed.

## 2023-01-28 ENCOUNTER — Ambulatory Visit (INDEPENDENT_AMBULATORY_CARE_PROVIDER_SITE_OTHER): Payer: 59 | Admitting: Family Medicine

## 2023-01-28 VITALS — BP 122/74 | HR 64 | Ht 65.0 in | Wt 229.0 lb

## 2023-01-28 DIAGNOSIS — R052 Subacute cough: Secondary | ICD-10-CM | POA: Diagnosis not present

## 2023-01-28 DIAGNOSIS — Z0000 Encounter for general adult medical examination without abnormal findings: Secondary | ICD-10-CM | POA: Diagnosis not present

## 2023-01-28 DIAGNOSIS — K219 Gastro-esophageal reflux disease without esophagitis: Secondary | ICD-10-CM

## 2023-01-28 DIAGNOSIS — E559 Vitamin D deficiency, unspecified: Secondary | ICD-10-CM

## 2023-01-28 DIAGNOSIS — E785 Hyperlipidemia, unspecified: Secondary | ICD-10-CM

## 2023-01-28 DIAGNOSIS — Z3A22 22 weeks gestation of pregnancy: Secondary | ICD-10-CM

## 2023-03-11 ENCOUNTER — Other Ambulatory Visit (INDEPENDENT_AMBULATORY_CARE_PROVIDER_SITE_OTHER): Payer: 59

## 2023-03-11 ENCOUNTER — Encounter: Payer: Self-pay | Admitting: Family Medicine

## 2023-03-11 ENCOUNTER — Telehealth: Payer: 59 | Admitting: Family Medicine

## 2023-03-11 VITALS — BP 105/63 | Temp 100.5°F | Ht 65.0 in | Wt 230.0 lb

## 2023-03-11 DIAGNOSIS — R058 Other specified cough: Secondary | ICD-10-CM

## 2023-03-11 DIAGNOSIS — R52 Pain, unspecified: Secondary | ICD-10-CM | POA: Diagnosis not present

## 2023-03-11 DIAGNOSIS — R509 Fever, unspecified: Secondary | ICD-10-CM

## 2023-03-11 DIAGNOSIS — J101 Influenza due to other identified influenza virus with other respiratory manifestations: Secondary | ICD-10-CM | POA: Diagnosis not present

## 2023-03-11 LAB — POC COVID19 BINAXNOW: SARS Coronavirus 2 Ag: NEGATIVE

## 2023-03-11 LAB — POCT INFLUENZA A/B
Influenza A, POC: POSITIVE — AB
Influenza B, POC: NEGATIVE

## 2023-03-11 MED ORDER — OSELTAMIVIR PHOSPHATE 75 MG PO CAPS
75.0000 mg | ORAL_CAPSULE | Freq: Two times a day (BID) | ORAL | 0 refills | Status: DC
Start: 2023-03-11 — End: 2023-05-21

## 2023-03-11 NOTE — Progress Notes (Signed)
Start time: 1:32 End time: 1:52  Virtual Visit via Video Note  I connected with Catherine Hill on 03/11/23 by a video enabled telemedicine application and verified that I am speaking with the correct person using two identifiers.  Location: Patient: home Provider: office   I discussed the limitations of evaluation and management by telemedicine and the availability of in person appointments. The patient expressed understanding and agreed to proceed.  History of Present Illness:  Chief Complaint  Patient presents with   Cough    VIRTUAL cough that started last night. Fever, chills and body aches that started this am. [redacted] weeks pregnant. No home covid tests.    Last night she started coughing. This morning she felt fatigued, body aches, headache, chills, fever. Only mild congestion, no drainage.  +sick contact at work (exposed Tuesday, and 5 people are sick today--2 had negative home tests for COVID)  PMH, PSH, SH reviewed  Outpatient Encounter Medications as of 03/11/2023  Medication Sig Note   acetaminophen (TYLENOL) 500 MG tablet Take 1,000 mg by mouth every 6 (six) hours as needed. 03/11/2023: Took 1000mg  at 10:30   ascorbic acid (VITAMIN C) 1000 MG tablet Take 1,000 mg by mouth daily.    cetirizine (ZYRTEC) 10 MG tablet Take 10 mg by mouth daily.    cholecalciferol (VITAMIN D3) 25 MCG (1000 UNIT) tablet Take 1,000 Units by mouth daily. 01/28/2023: Takes 5000 IU three times/week   Magnesium 250 MG TABS Take 1 tablet by mouth daily.    Prenatal Vit-Fe Fumarate-FA (PRENATAL MULTIVITAMIN) TABS tablet Take 1 tablet by mouth daily at 12 noon.    zinc gluconate 50 MG tablet Take 50 mg by mouth daily.    No facility-administered encounter medications on file as of 03/11/2023.   Allergies  Allergen Reactions   Cholestatin Other (See Comments)   ROS: No n/v/d +f/c, cough, body aches, headache. No rash. No chest pain or shortness of breath.    Observations/Objective:  BP  105/63   Temp (!) 100.5 F (38.1 C) (Tympanic)   Ht 5\' 5"  (1.651 m)   Wt 230 lb (104.3 kg)   LMP 06/01/2022   BMI 38.27 kg/m   Mildly-ill appearing female, in no distress. Speaking comfortably. No significant coughing during visit. Exam is limited due to the virtual nature of the visit.   Assessment and Plan:  Influenza A - Plan: oseltamivir (TAMIFLU) 75 MG capsule  [facility]  Stay well hydrated. Use Mucinex DM 12 hour tablets twice daily. Use sinus rinses as needed for sinus pressure.  Continue tylenol as needed for fever or pain.  We sent in Tamiflu 75mg  twice daily for 5 days. As we discussed, there is the option to prescribe a dose of 105 mg twice daily for pregnant patients.  I tried to check with Dr. Juliene Pina to see if she wanted the higher dose (I'm truly not sure WHEN the higher dose is needed--based on gestational age, severity of symptoms, etc). She didn't respond to my call. If you are able to reach her, please let her know I prescribed 75mg . If she feels that you should be on the higher dose, ask if she can send in the 30mg .  If she prefers [place] to do that, since we saw you, but she wants you on the higher dose, contact our office and we can send it in.    Follow Up Instructions:    I discussed the assessment and treatment plan with the patient.  The patient was provided an opportunity to ask questions and all were answered. The patient agreed with the plan and demonstrated an understanding of the instructions.   The patient was advised to call back or seek an in-person evaluation if the symptoms worsen or if the condition fails to improve as anticipated.  I spent 24 minutes dedicated to the care of this patient, including pre-visit review of records, face to face time, post-visit ordering of testing and documentation.    Lavonda Jumbo, MD

## 2023-03-11 NOTE — Patient Instructions (Signed)
Stay well hydrated. Use Mucinex DM 12 hour tablets twice daily. Use sinus rinses as needed for sinus pressure.  Continue tylenol as needed for fever or pain.  We sent in Tamiflu 75mg  twice daily for 5 days. As we discussed, there is the option to prescribe a dose of 105 mg twice daily for pregnant patients.  I tried to check with Dr. Juliene Pina to see if she wanted the higher dose (I'm truly not sure WHEN the higher dose is needed--based on gestational age, severity of symptoms, etc). She didn't respond to my call. If you are able to reach her, please let her know I prescribed 75mg . If she feels that you should be on the higher dose, ask if she can send in the 30mg .  If she prefers [place] to do that, since we saw you, but she wants you on the higher dose, contact our office and we can send it in.

## 2023-04-28 ENCOUNTER — Other Ambulatory Visit: Payer: Self-pay | Admitting: Obstetrics & Gynecology

## 2023-05-07 LAB — OB RESULTS CONSOLE GBS: GBS: NEGATIVE

## 2023-05-12 ENCOUNTER — Encounter (HOSPITAL_COMMUNITY): Payer: Self-pay

## 2023-05-12 NOTE — Patient Instructions (Addendum)
 Catherine Hill  05/12/2023   Your procedure is scheduled on:  05/26/2023  Arrive at 0830 at [employer] [facility] on [address] at [facility]  and [employer]. You are invited to use the FREE valet parking or use the Visitor's parking deck.  Pick up the phone at the desk and dial [phone].  Call this number if you have problems the morning of surgery: [phone]  Remember:   Do not eat food:(After Midnight) Desps de medianoche.  You may drink clear liquids until arrival at __0830___.  Clear liquids means a liquid you can see thru.  It can have color such as Cola or Kool aid.  Tea is OK and coffee as long as no milk or creamer of any kind.  Take these medicines the morning of surgery with A SIP OF WATER:  none   Do not wear jewelry, make-up or nail polish.  Do not wear lotions, powders, or perfumes. Do not wear deodorant.  Do not shave 48 hours prior to surgery.  Do not bring valuables to the hospital.  [facility] is not   responsible for any belongings or valuables brought to the hospital.  Contacts, dentures or bridgework may not be worn into surgery.  Leave suitcase in the car. After surgery it may be brought to your room.  For patients admitted to the hospital, checkout time is 11:00 AM the day of              discharge.      Please read over the following fact sheets that you were given:     Preparing for Surgery

## 2023-05-24 ENCOUNTER — Encounter (HOSPITAL_COMMUNITY)
Admission: RE | Admit: 2023-05-24 | Discharge: 2023-05-24 | Disposition: A | Discharge status: Home / Self Care | Source: Ambulatory Visit | Attending: Obstetrics & Gynecology | Admitting: Obstetrics & Gynecology

## 2023-05-24 DIAGNOSIS — Z01812 Encounter for preprocedural laboratory examination: Secondary | ICD-10-CM | POA: Insufficient documentation

## 2023-05-24 LAB — CBC
HCT: 35.2 % — ABNORMAL LOW (ref 36.0–46.0)
Hemoglobin: 12.2 g/dL (ref 12.0–15.0)
MCH: 32.1 pg (ref 26.0–34.0)
MCHC: 34.7 g/dL (ref 30.0–36.0)
MCV: 92.6 fL (ref 80.0–100.0)
Platelets: 149 10*3/uL — ABNORMAL LOW (ref 150–400)
RBC: 3.8 MIL/uL — ABNORMAL LOW (ref 3.87–5.11)
RDW: 13.2 % (ref 11.5–15.5)
WBC: 6.8 10*3/uL (ref 4.0–10.5)
nRBC: 0 % (ref 0.0–0.2)

## 2023-05-24 LAB — TYPE AND SCREEN
ABO/RH(D): A NEG
Antibody Screen: NEGATIVE

## 2023-05-24 LAB — RPR: RPR Ser Ql: NONREACTIVE

## 2023-05-25 NOTE — Anesthesia Preprocedure Evaluation (Signed)
 Anesthesia Evaluation  Patient identified by MRN, date of birth, ID band Patient awake    Reviewed: Allergy & Precautions, NPO status , Patient's Chart, lab work & pertinent test results  History of Anesthesia Complications Negative for: history of anesthetic complications  Airway Mallampati: II  TM Distance: >3 FB Neck ROM: Full    Dental   Pulmonary neg pulmonary ROS   Pulmonary exam normal breath sounds clear to auscultation       Cardiovascular negative cardio ROS  Rhythm:Regular Rate:Normal     Neuro/Psych negative neurological ROS     GI/Hepatic Neg liver ROS,GERD  Medicated,,  Endo/Other  neg diabetes  Class 3 obesity  Renal/GU      Musculoskeletal   Abdominal   Peds  Hematology negative hematology ROS (+) Lab Results      Component                Value               Date                      WBC                      6.8                 05/24/2023                HGB                      12.2                05/24/2023                HCT                      35.2 (L)            05/24/2023                MCV                      92.6                05/24/2023                PLT                      149 (L)             05/24/2023              Anesthesia Other Findings H/o c-section x1, laparoscopy x2  Reproductive/Obstetrics (+) Pregnancy Endometriosis                              Anesthesia Physical Anesthesia Plan  ASA: 3  Anesthesia Plan: Spinal   Post-op Pain Management:    Induction:   PONV Risk Score and Plan: Ondansetron, Dexamethasone and Treatment may vary due to age or medical condition  Airway Management Planned: Natural Airway  Additional Equipment:   Intra-op Plan:   Post-operative Plan:   Informed Consent: I have reviewed the patients History and Physical, chart, labs and discussed the procedure including the risks, benefits and alternatives for the  proposed anesthesia with the patient or authorized representative who has indicated his/her understanding and acceptance.  Plan Discussed with: CRNA and Anesthesiologist  Anesthesia Plan Comments: (I have discussed risks of neuraxial anesthesia including but not limited to infection, bleeding, nerve injury, back pain, headache, seizures, and failure of block. Patient denies bleeding disorders and is not currently anticoagulated. Labs have been reviewed. Risks and benefits discussed. All patient's questions answered.  )        Anesthesia Quick Evaluation

## 2023-05-26 ENCOUNTER — Encounter (HOSPITAL_COMMUNITY): Payer: Self-pay | Admitting: Obstetrics & Gynecology

## 2023-05-26 ENCOUNTER — Encounter (HOSPITAL_COMMUNITY)
Admission: RE | Disposition: A | Discharge status: Home / Self Care | Payer: Self-pay | Source: Home / Self Care | Attending: Obstetrics & Gynecology

## 2023-05-26 ENCOUNTER — Other Ambulatory Visit: Payer: Self-pay

## 2023-05-26 ENCOUNTER — Inpatient Hospital Stay (HOSPITAL_COMMUNITY): Admitting: Anesthesiology

## 2023-05-26 ENCOUNTER — Inpatient Hospital Stay (HOSPITAL_COMMUNITY)
Admission: RE | Admit: 2023-05-26 | Discharge: 2023-05-28 | DRG: 785 | Disposition: A | Discharge status: Home / Self Care | Payer: 59 | Attending: Obstetrics & Gynecology | Admitting: Obstetrics & Gynecology

## 2023-05-26 DIAGNOSIS — O9902 Anemia complicating childbirth: Secondary | ICD-10-CM | POA: Diagnosis present

## 2023-05-26 DIAGNOSIS — K219 Gastro-esophageal reflux disease without esophagitis: Secondary | ICD-10-CM | POA: Diagnosis present

## 2023-05-26 DIAGNOSIS — O3483 Maternal care for other abnormalities of pelvic organs, third trimester: Secondary | ICD-10-CM | POA: Diagnosis present

## 2023-05-26 DIAGNOSIS — O9962 Diseases of the digestive system complicating childbirth: Secondary | ICD-10-CM | POA: Diagnosis present

## 2023-05-26 DIAGNOSIS — O34211 Maternal care for low transverse scar from previous cesarean delivery: Secondary | ICD-10-CM

## 2023-05-26 DIAGNOSIS — O26893 Other specified pregnancy related conditions, third trimester: Secondary | ICD-10-CM | POA: Diagnosis present

## 2023-05-26 DIAGNOSIS — Z98891 History of uterine scar from previous surgery: Principal | ICD-10-CM

## 2023-05-26 DIAGNOSIS — O99214 Obesity complicating childbirth: Secondary | ICD-10-CM | POA: Diagnosis present

## 2023-05-26 DIAGNOSIS — Z302 Encounter for sterilization: Secondary | ICD-10-CM

## 2023-05-26 DIAGNOSIS — N80121 Deep endometriosis of right ovary: Secondary | ICD-10-CM | POA: Diagnosis present

## 2023-05-26 DIAGNOSIS — E66813 Obesity, class 3: Secondary | ICD-10-CM | POA: Diagnosis present

## 2023-05-26 DIAGNOSIS — Z3A39 39 weeks gestation of pregnancy: Secondary | ICD-10-CM

## 2023-05-26 DIAGNOSIS — Z6791 Unspecified blood type, Rh negative: Secondary | ICD-10-CM | POA: Diagnosis not present

## 2023-05-26 SURGERY — Surgical Case
Anesthesia: Spinal | Laterality: Bilateral

## 2023-05-26 MED ORDER — CEFAZOLIN SODIUM-DEXTROSE 2-4 GM/100ML-% IV SOLN
2.0000 g | INTRAVENOUS | Status: AC
  Administered 2023-05-26: 2 g via INTRAVENOUS

## 2023-05-26 MED ORDER — SODIUM CHLORIDE 0.9% FLUSH: 3.0000 mL | INTRAVENOUS | Status: DC | PRN

## 2023-05-26 MED ORDER — ACETAMINOPHEN 10 MG/ML IV SOLN
INTRAVENOUS | Status: AC
  Filled 2023-05-26: qty 100

## 2023-05-26 MED ORDER — DIPHENHYDRAMINE HCL 25 MG PO CAPS: 25.0000 mg | ORAL_CAPSULE | Freq: Four times a day (QID) | ORAL | Status: DC | PRN

## 2023-05-26 MED ORDER — FENTANYL CITRATE (PF) 100 MCG/2ML IJ SOLN
INTRAMUSCULAR | Status: DC | PRN
  Administered 2023-05-26: 15 ug via INTRATHECAL

## 2023-05-26 MED ORDER — METOCLOPRAMIDE HCL 5 MG/ML IJ SOLN
INTRAMUSCULAR | Status: AC
  Filled 2023-05-26: qty 2

## 2023-05-26 MED ORDER — TRIAMCINOLONE ACETONIDE 40 MG/ML IJ SUSP
INTRAMUSCULAR | Status: AC
  Filled 2023-05-26: qty 1

## 2023-05-26 MED ORDER — MORPHINE SULFATE (PF) 0.5 MG/ML IJ SOLN
INTRAMUSCULAR | Status: AC
  Filled 2023-05-26: qty 10

## 2023-05-26 MED ORDER — OXYTOCIN-SODIUM CHLORIDE 30-0.9 UT/500ML-% IV SOLN
INTRAVENOUS | Status: DC | PRN
  Administered 2023-05-26: 400 mL via INTRAVENOUS

## 2023-05-26 MED ORDER — PRENATAL MULTIVITAMIN CH
1.0000 | ORAL_TABLET | Freq: Every day | ORAL | Status: DC
  Administered 2023-05-26 – 2023-05-28 (×3): 1 via ORAL
  Filled 2023-05-26 (×3): qty 1

## 2023-05-26 MED ORDER — NALOXONE HCL 0.4 MG/ML IJ SOLN: 0.4000 mg | INTRAMUSCULAR | Status: DC | PRN

## 2023-05-26 MED ORDER — ZOLPIDEM TARTRATE 5 MG PO TABS: 5.0000 mg | ORAL_TABLET | Freq: Every evening | ORAL | Status: DC | PRN

## 2023-05-26 MED ORDER — DEXAMETHASONE SODIUM PHOSPHATE 10 MG/ML IJ SOLN
INTRAMUSCULAR | Status: DC | PRN
  Administered 2023-05-26: 8 mg via INTRAVENOUS

## 2023-05-26 MED ORDER — ACETAMINOPHEN 10 MG/ML IV SOLN: 1000.0000 mg | Freq: Once | INTRAVENOUS | Status: DC | PRN

## 2023-05-26 MED ORDER — ACETAMINOPHEN 10 MG/ML IV SOLN
INTRAVENOUS | Status: DC | PRN
Start: 2023-05-26 — End: 2023-05-26
  Administered 2023-05-26: 1000 mg via INTRAVENOUS

## 2023-05-26 MED ORDER — MORPHINE SULFATE (PF) 0.5 MG/ML IJ SOLN
INTRAMUSCULAR | Status: DC | PRN
  Administered 2023-05-26: 150 ug via INTRATHECAL

## 2023-05-26 MED ORDER — NALOXONE HCL 4 MG/10ML IJ SOLN: 1.0000 ug/kg/h | INTRAVENOUS | Status: DC | PRN

## 2023-05-26 MED ORDER — ACETAMINOPHEN 500 MG PO TABS
1000.0000 mg | ORAL_TABLET | Freq: Four times a day (QID) | ORAL | Status: DC
  Administered 2023-05-26 – 2023-05-28 (×8): 1000 mg via ORAL
  Filled 2023-05-26 (×8): qty 2

## 2023-05-26 MED ORDER — ACETAMINOPHEN 500 MG PO TABS: 1000.0000 mg | ORAL_TABLET | Freq: Four times a day (QID) | ORAL | Status: DC

## 2023-05-26 MED ORDER — COCONUT OIL OIL: 1.0000 | TOPICAL_OIL | Status: DC | PRN

## 2023-05-26 MED ORDER — LEVOCETIRIZINE DIHYDROCHLORIDE 5 MG PO TABS: 5.0000 mg | ORAL_TABLET | Freq: Every evening | ORAL | Status: DC

## 2023-05-26 MED ORDER — MENTHOL 3 MG MT LOZG: 1.0000 | LOZENGE | OROMUCOSAL | Status: DC | PRN

## 2023-05-26 MED ORDER — TRIAMCINOLONE ACETONIDE 40 MG/ML IJ SUSP
INTRAMUSCULAR | Status: DC | PRN
  Administered 2023-05-26: 40 mg via INTRAMUSCULAR

## 2023-05-26 MED ORDER — OXYCODONE HCL 5 MG PO TABS
5.0000 mg | ORAL_TABLET | ORAL | Status: DC | PRN
  Filled 2023-05-26: qty 2

## 2023-05-26 MED ORDER — WITCH HAZEL-GLYCERIN EX PADS: 1.0000 | MEDICATED_PAD | CUTANEOUS | Status: DC | PRN

## 2023-05-26 MED ORDER — STERILE WATER FOR IRRIGATION IR SOLN
Status: DC | PRN
  Administered 2023-05-26: 1

## 2023-05-26 MED ORDER — SENNOSIDES-DOCUSATE SODIUM 8.6-50 MG PO TABS
2.0000 | ORAL_TABLET | Freq: Every day | ORAL | Status: DC
  Administered 2023-05-27 – 2023-05-28 (×2): 2 via ORAL
  Filled 2023-05-26 (×2): qty 2

## 2023-05-26 MED ORDER — PRENATAL MULTIVITAMIN CH: 1.0000 | ORAL_TABLET | Freq: Every day | ORAL | Status: DC

## 2023-05-26 MED ORDER — FENTANYL CITRATE (PF) 100 MCG/2ML IJ SOLN
INTRAMUSCULAR | Status: AC
Start: 2023-05-26 — End: ?
  Filled 2023-05-26: qty 2

## 2023-05-26 MED ORDER — PHENYLEPHRINE 80 MCG/ML (10ML) SYRINGE FOR IV PUSH (FOR BLOOD PRESSURE SUPPORT)
PREFILLED_SYRINGE | INTRAVENOUS | Status: AC
  Filled 2023-05-26: qty 10

## 2023-05-26 MED ORDER — FENTANYL CITRATE (PF) 100 MCG/2ML IJ SOLN: 25.0000 ug | INTRAMUSCULAR | Status: DC | PRN

## 2023-05-26 MED ORDER — BUPIVACAINE IN DEXTROSE 0.75-8.25 % IT SOLN
INTRATHECAL | Status: DC | PRN
Start: 2023-05-26 — End: 2023-05-26
  Administered 2023-05-26: 1.6 mL via INTRATHECAL

## 2023-05-26 MED ORDER — ONDANSETRON HCL 4 MG/2ML IJ SOLN: 4.0000 mg | Freq: Three times a day (TID) | INTRAMUSCULAR | Status: DC | PRN

## 2023-05-26 MED ORDER — SIMETHICONE 80 MG PO CHEW
80.0000 mg | CHEWABLE_TABLET | Freq: Three times a day (TID) | ORAL | Status: DC
  Administered 2023-05-26 – 2023-05-28 (×5): 80 mg via ORAL
  Filled 2023-05-26 (×7): qty 1

## 2023-05-26 MED ORDER — ONDANSETRON HCL 4 MG/2ML IJ SOLN
INTRAMUSCULAR | Status: DC | PRN
  Administered 2023-05-26: 4 mg via INTRAVENOUS

## 2023-05-26 MED ORDER — IBUPROFEN 600 MG PO TABS
600.0000 mg | ORAL_TABLET | Freq: Four times a day (QID) | ORAL | Status: DC
  Administered 2023-05-27 – 2023-05-28 (×5): 600 mg via ORAL
  Filled 2023-05-26 (×5): qty 1

## 2023-05-26 MED ORDER — PANTOPRAZOLE SODIUM 40 MG PO TBEC
40.0000 mg | DELAYED_RELEASE_TABLET | Freq: Every day | ORAL | Status: DC
  Administered 2023-05-26 – 2023-05-28 (×3): 40 mg via ORAL
  Filled 2023-05-26 (×3): qty 1

## 2023-05-26 MED ORDER — POVIDONE-IODINE 10 % EX SWAB
2.0000 | Freq: Once | CUTANEOUS | Status: AC
  Administered 2023-05-26: 2 via TOPICAL

## 2023-05-26 MED ORDER — PHENYLEPHRINE HCL-NACL 20-0.9 MG/250ML-% IV SOLN
INTRAVENOUS | Status: AC
  Filled 2023-05-26: qty 250

## 2023-05-26 MED ORDER — OXYTOCIN-SODIUM CHLORIDE 30-0.9 UT/500ML-% IV SOLN
INTRAVENOUS | Status: AC
  Filled 2023-05-26: qty 500

## 2023-05-26 MED ORDER — OXYTOCIN-SODIUM CHLORIDE 30-0.9 UT/500ML-% IV SOLN: 2.5000 [IU]/h | INTRAVENOUS | Status: AC

## 2023-05-26 MED ORDER — SIMETHICONE 80 MG PO CHEW: 80.0000 mg | CHEWABLE_TABLET | ORAL | Status: DC | PRN

## 2023-05-26 MED ORDER — DIBUCAINE (PERIANAL) 1 % EX OINT: 1.0000 | TOPICAL_OINTMENT | CUTANEOUS | Status: DC | PRN

## 2023-05-26 MED ORDER — CEFAZOLIN SODIUM-DEXTROSE 2-4 GM/100ML-% IV SOLN
INTRAVENOUS | Status: AC
  Filled 2023-05-26: qty 100

## 2023-05-26 MED ORDER — MAGNESIUM GLYCINATE 100 MG PO CAPS: ORAL_CAPSULE | Freq: Every day | ORAL | Status: DC

## 2023-05-26 MED ORDER — PHENYLEPHRINE HCL-NACL 20-0.9 MG/250ML-% IV SOLN
INTRAVENOUS | Status: DC | PRN
  Administered 2023-05-26: 60 ug/min via INTRAVENOUS

## 2023-05-26 MED ORDER — KETOROLAC TROMETHAMINE 30 MG/ML IJ SOLN: 30.0000 mg | Freq: Four times a day (QID) | INTRAMUSCULAR | Status: AC | PRN

## 2023-05-26 MED ORDER — LORATADINE 10 MG PO TABS
10.0000 mg | ORAL_TABLET | Freq: Every day | ORAL | Status: DC
  Administered 2023-05-26 – 2023-05-27 (×2): 10 mg via ORAL
  Filled 2023-05-26 (×2): qty 1

## 2023-05-26 MED ORDER — ONDANSETRON HCL 4 MG/2ML IJ SOLN
INTRAMUSCULAR | Status: AC
  Filled 2023-05-26: qty 2

## 2023-05-26 MED ORDER — KETOROLAC TROMETHAMINE 30 MG/ML IJ SOLN
30.0000 mg | Freq: Four times a day (QID) | INTRAMUSCULAR | Status: AC
  Administered 2023-05-26 – 2023-05-27 (×3): 30 mg via INTRAVENOUS
  Filled 2023-05-26 (×3): qty 1

## 2023-05-26 MED ORDER — DEXAMETHASONE SODIUM PHOSPHATE 4 MG/ML IJ SOLN
INTRAMUSCULAR | Status: AC
  Filled 2023-05-26: qty 2

## 2023-05-26 MED ORDER — DIPHENHYDRAMINE HCL 50 MG/ML IJ SOLN: 12.5000 mg | Freq: Four times a day (QID) | INTRAMUSCULAR | Status: DC | PRN

## 2023-05-26 MED ORDER — LACTATED RINGERS IV SOLN: INTRAVENOUS | Status: DC

## 2023-05-26 SURGICAL SUPPLY — 35 items
BARRIER ADHS 3X4 INTERCEED (GAUZE/BANDAGES/DRESSINGS) IMPLANT
BENZOIN TINCTURE PRP APPL 2/3 (GAUZE/BANDAGES/DRESSINGS) IMPLANT
CHLORAPREP W/TINT 26ML (MISCELLANEOUS) ×2 IMPLANT
CLAMP UMBILICAL CORD (MISCELLANEOUS) ×1 IMPLANT
CLOTH BEACON ORANGE TIMEOUT ST (SAFETY) ×1 IMPLANT
DRSG OPSITE POSTOP 4X10 (GAUZE/BANDAGES/DRESSINGS) ×1 IMPLANT
ELECT REM PT RETURN 9FT ADLT (ELECTROSURGICAL) ×2 IMPLANT
ELECTRODE REM PT RTRN 9FT ADLT (ELECTROSURGICAL) ×1 IMPLANT
EXTRACTOR VACUUM KIWI (MISCELLANEOUS) IMPLANT
EXTRACTOR VACUUM M CUP 4 TUBE (SUCTIONS) IMPLANT
GAUZE PAD ABD 7.5X8 STRL (GAUZE/BANDAGES/DRESSINGS) IMPLANT
GAUZE SPONGE 4X4 12PLY STRL LF (GAUZE/BANDAGES/DRESSINGS) IMPLANT
GLOVE BIO SURGEON STRL SZ7 (GLOVE) ×1 IMPLANT
GLOVE BIOGEL PI IND STRL 7.0 (GLOVE) ×3 IMPLANT
GOWN STRL REUS W/TWL LRG LVL3 (GOWN DISPOSABLE) ×3 IMPLANT
KIT ABG SYR 3ML LUER SLIP (SYRINGE) IMPLANT
LIGASURE IMPACT 36 18CM CVD LR (INSTRUMENTS) IMPLANT
NDL HYPO 25X5/8 SAFETYGLIDE (NEEDLE) IMPLANT
NEEDLE HYPO 25X5/8 SAFETYGLIDE (NEEDLE) IMPLANT
NS IRRIG 1000ML POUR BTL (IV SOLUTION) ×1 IMPLANT
PACK C SECTION WH (CUSTOM PROCEDURE TRAY) ×1 IMPLANT
PAD OB MATERNITY 4.3X12.25 (PERSONAL CARE ITEMS) ×1 IMPLANT
RTRCTR C-SECT PINK 25CM LRG (MISCELLANEOUS) IMPLANT
STRIP CLOSURE SKIN 1/2X4 (GAUZE/BANDAGES/DRESSINGS) IMPLANT
SUT MNCRL 0 VIOLET CTX 36 (SUTURE) ×2 IMPLANT
SUT MNCRL AB 3-0 PS2 27 (SUTURE) IMPLANT
SUT PLAIN 0 NONE (SUTURE) IMPLANT
SUT PLAIN ABS 2-0 CT1 27XMFL (SUTURE) IMPLANT
SUT VIC AB 0 CT1 27XBRD ANBCTR (SUTURE) ×2 IMPLANT
SUT VIC AB 2-0 CT1 TAPERPNT 27 (SUTURE) ×1 IMPLANT
SUT VIC AB 4-0 KS 27 (SUTURE) ×1 IMPLANT
SYR CONTROL 10ML LL (SYRINGE) IMPLANT
TOWEL OR 17X24 6PK STRL BLUE (TOWEL DISPOSABLE) ×1 IMPLANT
TRAY FOLEY W/BAG SLVR 14FR LF (SET/KITS/TRAYS/PACK) IMPLANT
WATER STERILE IRR 1000ML POUR (IV SOLUTION) ×1 IMPLANT

## 2023-05-26 NOTE — Anesthesia Procedure Notes (Signed)
 Spinal  Patient location during procedure: OR Start time: 05/26/2023 10:39 AM End time: 05/26/2023 10:42 AM Reason for block: surgical anesthesia Staffing Performed: anesthesiologist  Anesthesiologist: Conard Decent, MD Performed by: Conard Decent, MD Authorized by: Conard Decent, MD   Preanesthetic Checklist Completed: patient identified, IV checked, site marked, risks and benefits discussed, surgical consent, monitors and equipment checked, pre-op evaluation and timeout performed Spinal Block Patient position: sitting Prep: DuraPrep Patient monitoring: blood pressure and continuous pulse ox Approach: midline Location: L3-4 Injection technique: single-shot Needle Needle type: Pencan  Needle gauge: 24 G Needle length: 9 cm Assessment Sensory level: T4 Additional Notes Risks and benefits of neuraxial anesthesia including, but not limited to, infection, bleeding, local anesthetic toxicity, headache, hypotension, back pain, block failure, etc. were discussed with the patient. The patient expressed understanding and consented to the procedure. I confirmed that the patient has no bleeding disorders and is not taking blood thinners. I confirmed the patient's last platelet count with the nurse. Monitors were applied. A time-out was performed immediately prior to the procedure. Sterile technique was used throughout the whole procedure.   1 attempt(s)

## 2023-05-26 NOTE — H&P (Addendum)
 Catherine Hill is a 38 y.o. female presenting for elective repeat C/section and tubal sterilization  38 yo MF, G3P1011. H/o infertility and prior IVF . Now spontaneous pregnancy/  AMA, NIPT LR and also did NT sono at 12 wks, nl.  1st kid C/s for macrosomia, elevated BP on c/s day. Needs to start bbASA at 10 wks Sono noted small endometrioma. EDC 06/01/23, No cats as pets. Hx chicken pox infection.  C/s x 1 for macrosomia Rh neg,, husband Rh neg and 1st kid Rh neg and baby Rh neg on NIPT  AMA NIPT LR Anatomy nl  Growth at 32 wks - EFW 4'15" 85% BPD 99% AFI 13 cm Breech but turned Cephalic at 36 wks f/up sono  BPP last week for DFM, 8/8 and nl AFI    . OB History     Gravida  3   Para  1   Term  1   Preterm  0   AB  1   Living  1      SAB  1   IAB  0   Ectopic  0   Multiple  0   Live Births  1          Past Medical History:  Diagnosis Date   Allergy    seasonal   Dysmenorrhea    Endometriosis    Fibrocystic breast disease    Infertility, female    Mononucleosis 02/2011   Ovarian cyst 02/2011   Vaginal Pap smear, abnormal    age 37, rpt normal   Past Surgical History:  Procedure Laterality Date   CESAREAN SECTION N/A 06/06/2020   Procedure: Primary CESAREAN SECTION;  Surgeon: Audelia Leaks, MD;  Location: [facility] ORS;  Service: Obstetrics;  Laterality: N/A;  EDD: 06/13/20   LAPAROSCOPY  03/07/2011   Procedure: LAPAROSCOPY OPERATIVE;  Surgeon: Shasta Deist, MD;  Location: [facility] ORS;  Service: Gynecology;  Laterality: Left;  Diagnostic laparoscopy and drainage of left ovarian cyst. Repair of cervical laceration.   LAPAROSCOPY  03/2018   treatment of endometriosis (at [facility])   WISDOM TOOTH EXTRACTION     Family History: family history includes Alcohol abuse in her maternal aunt and sister; Anxiety disorder in her mother, sister, and sister; Arthritis in her father and mother; Asthma in her sister; COPD in her maternal grandfather; Cancer in her father, maternal  aunt, and paternal grandfather; Depression in her maternal aunt, maternal uncle, sister, and sister; Diabetes in her maternal uncle; Heart disease in her father; Hyperlipidemia in her father, mother, sister, and sister; Miscarriages / Stillbirths in her sister; Stroke in her father; Throat cancer (age of onset: 44) in her father; Thyroid disease in her mother; Vision loss in her father. Social History:  reports that she has never smoked. She has never used smokeless tobacco. She reports that she does not currently use alcohol after a past usage of about 4.0 standard drinks of alcohol per week. She reports that she does not use drugs.     Maternal Diabetes: No Genetic Screening: Normal Maternal Ultrasounds/Referrals: Normal Fetal Ultrasounds or other Referrals:  None Maternal Substance Abuse:  No Significant Maternal Medications:  Meds include: Other: bbASA, Pantoprazole Significant Maternal Lab Results:  Group B Strep negative Number of Prenatal Visits:greater than 3 verified prenatal visits Maternal Vaccinations:TDap and Flu Other Comments:  None  Review of Systems History   Blood pressure 117/77, pulse 87, temperature 97.6 F (36.4 C), temperature source Oral, resp. rate 18, last menstrual period  06/01/2022, SpO2 98%. Exam Physical Exam   A&O x 3, no acute distress. Pleasant HEENT neg, no thyromegaly Lungs CTA bilat CV RRR, S1S2 normal Abdo soft, non tender, non acute Extr no edema/ tenderness Pelvic  Cx closed at office exam  FHT  130s  Toco none   Prenatal labs: ABO, Rh: --/--/A NEG (04/14 1004) Antibody: NEG (04/14 1004) Rubella:  Immune RPR: NON REACTIVE (04/14 1030)  HBsAg:   NEG hEPc NEG  HIV:   NEG GBS:   NEG Glucola nl  NIPT LR AFP1 neg    Assessment/Plan: 38 yo G3P1011 at 39 wks here for repeat elective C/section and tubal sterilization with bilateral salpingectomy and possible removal of right ovarian endometrioma  Risks/complications of surgery reviewed  incl infection, bleeding, damage to internal organs including bladder, bowels, ureters, blood vessels, other risks from anesthesia, VTE and delayed complications of any surgery, complications in future surgery reviewed. Also discussed neonatal complications incl difficult delivery, laceration, vacuum assistance, TTN etc. Pt understands and agrees, all concerns addressed.      Shasta Deist 05/26/2023, 8:43 AM

## 2023-05-26 NOTE — Anesthesia Postprocedure Evaluation (Signed)
 Anesthesia Post Note  Patient: Catherine Hill  Procedure(s) Performed: CESAREAN SECTION, WITH BILATERAL TUBAL LIGATION (Bilateral)     Patient location during evaluation: PACU Anesthesia Type: Spinal Level of consciousness: awake Pain management: pain level controlled Vital Signs Assessment: post-procedure vital signs reviewed and stable Respiratory status: spontaneous breathing, respiratory function stable and nonlabored ventilation Cardiovascular status: blood pressure returned to baseline and stable Postop Assessment: no headache, no backache and no apparent nausea or vomiting Anesthetic complications: no   No notable events documented.  Last Vitals:  Vitals:   05/26/23 1532 05/26/23 1654  BP: 114/69   Pulse: 62   Resp: 20 20  Temp:    SpO2:      Last Pain:  Vitals:   05/26/23 1532  TempSrc:   PainSc: 0-No pain                 Conard Decent

## 2023-05-26 NOTE — Lactation Note (Signed)
 This note was copied from a baby's chart. Lactation Consultation Note  Patient Name: Catherine Hill EAVWU'J Date: 05/26/2023 Age:38 hours Reason for consult: Initial assessment;Mother's request;Term;Maternal endocrine disorder (hx of maternal infertility, endometriosis and fibrocystic breast disease)  P2- Per MOB, she exclusively pumped for 3 months for her first child because it was very painful. Her first child had a tongue and lip tie that was diagnosed at 38 years of age. MOB requested assistance with latching infant. LC assisted with placing infant on the right breast in the football hold. Infant was eager to latch and when he did, he had a strong rhythmic suck. Infant nursed for 15 minutes. No swallows were seen or heard. MOB requested a manual pump for further stimulation. LC provided the manual pump with 18 mm flanges. LC reviewed how to use it and clean it. MOB denied having further questions or concerns.  LC reviewed the first 24 hr birthday nap, day 2 cluster feeding, feeding infant on cue 8-12x in 24 hrs, not allowing infant to go over 3 hrs without a feeding, CDC milk storage guidelines, LC services handout and engorgement/breast care. LC encouraged MOB to call for further assistance as needed.  Maternal Data Has patient been taught Hand Expression?: No Does the patient have breastfeeding experience prior to this delivery?: Yes How long did the patient breastfeed?: 3 months of exclusive pumping  Feeding Mother's Current Feeding Choice: Breast Milk  LATCH Score Latch: Grasps breast easily, tongue down, lips flanged, rhythmical sucking.  Audible Swallowing: None  Type of Nipple: Everted at rest and after stimulation  Comfort (Breast/Nipple): Soft / non-tender  Hold (Positioning): Assistance needed to correctly position infant at breast and maintain latch.  LATCH Score: 7   Lactation Tools Discussed/Used Tools: Pump;Flanges Flange Size: 18 Breast pump type: Manual Pump  Education: Setup, frequency, and cleaning;Milk Storage Reason for Pumping: MOB request for extra stimulation due to hx of low supply Pumping frequency: 15-20 min every 3 hrs  Interventions Interventions: Breast feeding basics reviewed;Assisted with latch;Breast compression;Support pillows;Adjust position;Position options;Hand pump;Education;LC Services brochure  Discharge Discharge Education: Engorgement and breast care;Warning signs for feeding baby Pump: DEBP;Personal Deno Flair)  Consult Status Consult Status: Follow-up Date: 05/27/23 Follow-up type: In-patient    Vernette Goo BS, IBCLC 05/26/2023, 4:17 PM

## 2023-05-26 NOTE — Transfer of Care (Signed)
 Immediate Anesthesia Transfer of Care Note  Patient: Catherine Hill  Procedure(s) Performed: CESAREAN SECTION, WITH BILATERAL TUBAL LIGATION (Bilateral)  Patient Location: PACU  Anesthesia Type:Spinal  Level of Consciousness: awake, alert , and oriented  Airway & Oxygen Therapy: Patient Spontanous Breathing  Post-op Assessment: Report given to RN and Post -op Vital signs reviewed and stable  Post vital signs: Reviewed and stable  Last Vitals:  Vitals Value Taken Time  BP    Temp    Pulse 65 05/26/23 1212  Resp 15 05/26/23 1212  SpO2 93 % 05/26/23 1212  Vitals shown include unfiled device data.  Last Pain:  Vitals:   05/26/23 0838  TempSrc: Oral         Complications: No notable events documented.

## 2023-05-26 NOTE — Op Note (Signed)
 Cesarean Section Procedure Note   [name]  05/26/2023 Procedure: Repeat Low Transverse Cesarean section and bilateral salpingectomy for sterilization, right ovarian endometrioma cyst wall excision   Indications: Scheduled Proceedure/Maternal Request 39 weeks   Pre-operative Diagnosis: Previous Cesarean Section, Desires Sterilization. Right ovarian endometrioma   Post-operative Diagnosis: Same   Surgeon: Terri Fester, MD   Assistants: none  Anesthesia: spinal   Procedure Details:  The patient was seen in the Holding Room. The risks, benefits, complications, treatment options, and expected outcomes were discussed with the patient. The patient concurred with the proposed plan, giving informed consent. identified as Catherine Hill and the procedure verified as C-Section Delivery. A Time Out was held and the above information confirmed. 2 gm Ancef given.  After induction of anesthesia, the patient was draped and prepped in the usual sterile manner, foley was draining urine well.  A pfannenstiel incision was made and old keloid excised with elipitical incision around it. Then incision carried down through the subcutaneous tissue to the fascia. Fascial incision was made and extended transversely. The fascia was separated from the underlying rectus tissue superiorly and inferiorly. The peritoneum was identified and entered. Peritoneal incision was extended longitudinally. Alexis-O retractor placed. The utero-vesical peritoneal reflection was incised transversely and the bladder flap was bluntly freed from the lower uterine segment. A low transverse uterine incision was made. Amniotomy noted clear copious fluid. Delivered from cephalic presentation was a FEMALE infant with vigorous cry. Apgar scores of 9 at one minute and 9 at five minutes. Delayed cord clamping done at 1 minute and baby handed to NICU team in attendance. Cord ph was not sent. Cord blood was obtained for evaluation. The placenta  was removed Intact and appeared normal. The uterine outline, tubes appeared normal. Right ovary noted collapsed endometrioma with yellow stained cyst.. The uterine incision was closed with running locked sutures of . A second imbricating layer was not needed since hemostasis was observed.  Both tubes were normal and bilateral salpingectomy done with LigaSure and hemostasis noted. Right ovary had a collapsed yellow stained cyst and wall was excised. Hemostasis obtained with cautery and figure of eight stitch w/ 3-0 Monocryl.  Alexis retractor removed. Peritoneal closure done with 2-0 Vicryl.  The fascia was then reapproximated with running sutures of 0Vicryl. The subcuticular closure was performed using 2-0plain gut. Kenalog injected in skin and skin was closed with 4-0Vicryl. Steristrips, honeycomb and pressure dressings placed.   Instrument, sponge, and needle counts were correct prior the abdominal closure and were correct at the conclusion of the case.   Findings: Female infant delivered from [employer] hysterotomy cephalic. Clear amniotic fluid. Collapsed right ovarian cyst wall excised    Estimated Blood Loss: 384 cc   Total IV Fluids: 1600 ml LR   Urine Output: 200 cc clear in foley   Specimens: cord blood and both fallopian tubes   Complications: no complications  Disposition: PACU - hemodynamically stable.   Maternal Condition: stable   Baby condition / location:  Couplet care / Skin to Skin  Attending Attestation: I performed the procedure.   Signed: Surgeon(s): Terri Fester, MD

## 2023-05-27 LAB — CBC
HCT: 29.6 % — ABNORMAL LOW (ref 36.0–46.0)
Hemoglobin: 10.1 g/dL — ABNORMAL LOW (ref 12.0–15.0)
MCH: 31.3 pg (ref 26.0–34.0)
MCHC: 34.1 g/dL (ref 30.0–36.0)
MCV: 91.6 fL (ref 80.0–100.0)
Platelets: 136 10*3/uL — ABNORMAL LOW (ref 150–400)
RBC: 3.23 MIL/uL — ABNORMAL LOW (ref 3.87–5.11)
RDW: 13.1 % (ref 11.5–15.5)
WBC: 11.6 10*3/uL — ABNORMAL HIGH (ref 4.0–10.5)
nRBC: 0 % (ref 0.0–0.2)

## 2023-05-27 NOTE — Lactation Note (Signed)
 This note was copied from a baby's chart. Lactation Consultation Note  Patient Name: Catherine Hill ZOXWR'U Date: 05/27/2023 Age:38 hours Reason for consult: Follow-up assessment;Mother's request;Term;Infant weight loss;Maternal endocrine disorder;Breastfeeding assistance (hx maternal infertility, endometriosis, fibrocystic breast disease)  P2- MOB requested for LC to check on her to make sure everything is going well with infant and the feedings. Per MOB, infant was sleepy today after his circumcision, but now he is ravenous on the breast. MOB has been concerned about infant's 6% weight loss in the first 24 hrs. LC reassured MOB that 6% is still at an acceptable level and infant seems to be feeding well. LC requested to express MOB's breasts to see if we could see colostrum. MOB consented. LC first expressed the right breast and nothing was present. MOB reports that infant had just finished nursing for 11 minutes on that right breast. LC then expressed the left breast and a large drop of colostrum easily expressed with a gentle squeeze. MOB felt much better.  Infant was actively trying to latch back onto the breast, so MOB latched him on the left breast in the cradle hold. Infant's lips needed flanging, so LC demonstrated how to do that. Overall this feeding and latch looked much better than yesterday's feeding that LC observed. MOB now plans to offer 7-12 mL of formula after every breast feeding to ensure infant's weight doesn't drop further than it had. MOB reports that supplementing \\after  feedings until her milk comes in will make her feel more comfortable. LC reviewed day 2 cluster feeding. LC also encouraged MOB to call for further assistance as needed.  Maternal Data Has patient been taught Hand Expression?: Yes Does the patient have breastfeeding experience prior to this delivery?: Yes How long did the patient breastfeed?: 3 months exclusive pumping  Feeding Mother's Current Feeding  Choice: Breast Milk and Formula  LATCH Score Latch: Grasps breast easily, tongue down, lips flanged, rhythmical sucking.  Audible Swallowing: Spontaneous and intermittent  Type of Nipple: Everted at rest and after stimulation  Comfort (Breast/Nipple): Soft / non-tender  Hold (Positioning): Assistance needed to correctly position infant at breast and maintain latch.  LATCH Score: 9   Lactation Tools Discussed/Used Tools: Pump;Flanges Flange Size: 18 Breast pump type: Manual Pump Education: Setup, frequency, and cleaning;Milk Storage Reason for Pumping: MOB request Pumping frequency: 15-20 min every 3 hrs  Interventions Interventions: Breast feeding basics reviewed;Assisted with latch;Hand express;Breast compression;Adjust position;Support pillows;Position options;Hand pump;Education;LC Services brochure  Discharge Discharge Education: Engorgement and breast care;Warning signs for feeding baby Pump: DEBP;Personal (Spectra )  Consult Status Consult Status: Follow-up Date: 05/28/23 Follow-up type: In-patient    Vernette Goo BS, IBCLC 05/27/2023, 5:02 PM

## 2023-05-27 NOTE — Progress Notes (Signed)
 Subjective: Postpartum Day One: Cesarean Delivery Patient reports doing well. States pain well controlled with current PO pain regimen. Ambulating without dizziness. Minimal VB. Spontaneously voiding and passing gas. Some inc swelling in ankles noted. No HA, CP, or SOB.  Baby boy doing well at bedside. Elects for neonatal circumcision.    Objective: Patient Vitals for the past 24 hrs:  BP Temp Temp src Pulse Resp SpO2  05/27/23 0300 117/61 98 F (36.7 C) Oral -- 18 --  05/27/23 0006 124/66 98 F (36.7 C) Oral -- 18 --  05/26/23 2103 112/60 98.2 F (36.8 C) Oral 79 18 99 %  05/26/23 1654 122/89 -- -- 68 20 99 %  05/26/23 1532 114/69 -- -- 62 20 --  05/26/23 1449 95/65 -- Oral (!) 53 20 99 %  05/26/23 1344 109/70 98.4 F (36.9 C) Oral (!) 54 -- --  05/26/23 1320 -- -- -- 81 -- 98 %  05/26/23 1315 (!) 94/57 -- -- (!) 59 -- 98 %  05/26/23 1310 -- -- -- 62 -- 94 %  05/26/23 1305 -- -- -- 65 -- 99 %  05/26/23 1300 (!) 82/35 (!) 97.4 F (36.3 C) Oral 70 -- 98 %  05/26/23 1255 -- -- -- (!) 57 -- 97 %  05/26/23 1250 -- -- -- (!) 58 -- 98 %  05/26/23 1245 96/82 -- -- 63 -- 97 %  05/26/23 1240 -- -- -- 65 -- 97 %  05/26/23 1235 -- -- -- (!) 56 -- 98 %  05/26/23 1230 106/84 -- -- 70 (!) 31 98 %  05/26/23 1225 -- -- -- 60 (!) 21 100 %  05/26/23 1220 114/62 -- -- 65 17 98 %  05/26/23 1215 91/65 (!) 96.8 F (36 C) -- 66 18 99 %  05/26/23 1210 -- -- -- 95 15 96 %    Intake/Output Summary (Last 24 hours) at 05/27/2023 1134 Last data filed at 05/27/2023 1610 Gross per 24 hour  Intake 1000 ml  Output 2496 ml  Net -1496 ml     Physical Exam:  General: alert and no distress Lochia: appropriate Uterine Fundus: firm Incision: healing well, no significant drainage, no dehiscence, no significant erythema DVT Evaluation: Calf/Ankle edema is present- trace b/l  Recent Labs    05/27/23 0536  HGB 10.1*  HCT 29.6*    Assessment/Plan: Status post Cesarean section. Doing well  postoperatively.  Continue current care. AIDEL DAVISSON [id number] POD#1 sp repeat c/s at [redacted]w[redacted]d 1. PPC: cont current PO regimen, encourage ambulation patient reassured of normal transient post-delivery swelling and will resolve with time 2. Acute blood loss anemia, not clinically significant. Hgb stable this morning at 10.1 asymptomatic and appropriate lochia 3. RH NEG, Baby Rh NEG, no Rhogam indicated 4. LC support PRN  Consented for neonatal circumcision Continue inpatient postop care, anticipate DC home tomorrow POD2  Rohit Deloria A Ryelan Kazee 05/27/2023, 11:33 AM

## 2023-05-28 LAB — SURGICAL PATHOLOGY

## 2023-05-28 MED ORDER — IBUPROFEN 800 MG PO TABS: 800.0000 mg | ORAL_TABLET | Freq: Three times a day (TID) | ORAL | 0 refills | Status: DC

## 2023-05-28 MED ORDER — ACETAMINOPHEN 500 MG PO TABS: 1000.0000 mg | ORAL_TABLET | Freq: Four times a day (QID) | ORAL | 0 refills | Status: DC

## 2023-05-28 NOTE — Lactation Note (Signed)
 This note was copied from a baby's chart. Lactation Consultation Note  Patient Name: Boy Reanne Nellums RUEAV'W Date: 05/28/2023 Age:38 hours  Reason for consult: Follow-up assessment;Term;Infant weight loss  P2, [redacted]w[redacted]d, 8% weight loss  Follow up LC visit. Mother was breastfeeding baby upon arrival. Mother demonstrated good positioning and latching. Mother has colostrum present. Infant latches and suckles but not sustaining latch. Mother has a history of low milk supply and is supplementing with formula after breastfeeding.    Discussed the process of milk production, "supply and demand" and the importance of breast stimulation and milk removal in order to make an optimal milk supply. Discussed mother to breastfeed 8-12 times in 24 hours, skin to skin and breast feed before formula feeding.  If missed feedings at breast or substituting feeding with formula, advised to hand express and/or pump to remove milk from the breast.   Mother would like a referral made to OP LC at [facility] for continued support and evaluation of breastfeeding. Referral sent.   Feeding Mother's Current Feeding Choice: Breast Milk and Formula Nipple Type: Slow - flow  LATCH Score Latch: Repeated attempts needed to sustain latch, nipple held in mouth throughout feeding, stimulation needed to elicit sucking reflex.  Audible Swallowing: A few with stimulation  Type of Nipple: Everted at rest and after stimulation  Comfort (Breast/Nipple): Soft / non-tender  Hold (Positioning): No assistance needed to correctly position infant at breast.  LATCH Score: 8    Interventions Interventions: Breast feeding basics reviewed;Assisted with latch;Education  Discharge Discharge Education: Engorgement and breast care;Warning signs for feeding baby;Outpatient recommendation;Outpatient Epic message sent Pump: Personal;DEBP  Consult Status Consult Status: Complete Date: 05/28/23    Esperanza Hedges 05/28/2023, 1:56  PM

## 2023-05-28 NOTE — Discharge Summary (Signed)
 OB Discharge Summary  Patient Name: Catherine Hill DOB: [birth year] MRN: [id number]  Date of admission: 05/26/2023 Delivering provider: MODY, VAISHALI  Admitting diagnosis: Previous cesarean section [Z98.891] Status post repeat low transverse cesarean section [Z98.891] Intrauterine pregnancy: [redacted]w[redacted]d     Secondary diagnosis: Patient Active Problem List   Diagnosis Date Noted   Previous cesarean section 05/26/2023   Status post repeat low transverse cesarean section 05/26/2023   RhD negative 06/07/2020   Postpartum care following cesarean delivery - 4/16 06/06/2020   Date of discharge: 05/28/2023   Discharge diagnosis: Principal Problem:   Postpartum care following cesarean delivery - 4/16 Active Problems:   RhD negative   Previous cesarean section   Status post repeat low transverse cesarean section                                                       Augmentation: N/A Pain control: Spinal Laceration:None Complications: None  Hospital course:  Scheduled C/S   38 y.o. yo [id number] at [redacted]w[redacted]d was admitted to the hospital 05/26/2023 for scheduled cesarean section with the following indication:Elective Repeat.Delivery details are as follows:  Membrane Rupture Time/Date:  ,   Delivery Method:C-Section, Low Transverse Operative Delivery:N/A Details of operation can be found in separate operative note. Patient had an uncomplicated postpartum course. She is ambulating, tolerating a regular diet, passing flatus, and urinating well. Patient is discharged home in stable condition on  05/28/23        Newborn Data: Birth date:05/26/2023 Birth time:11:10 AM Gender:Female Living status:Living Apgars:9 ,9  Weight:3860 g    Physical exam  Vitals:   05/27/23 0300 05/27/23 1219 05/27/23 2008 05/28/23 0527  BP: 117/61 95/60 (!) 99/48 97/70  Pulse:  63 (!) 59 (!) 59  Resp: 18 19 16 16   Temp: 98 F (36.7 C) 97.9 F (36.6 C) 97.7 F (36.5 C) 97.8 F (36.6 C)  TempSrc: Oral Oral Oral Oral   SpO2:  99% 99%   Weight:      Height:       General: alert and cooperative Lochia: appropriate Uterine Fundus: firm Incision: Healing well with no significant drainage DVT Evaluation: No evidence of DVT seen on physical exam.  Labs: Lab Results  Component Value Date   WBC 11.6 (H) 05/27/2023   HGB 10.1 (L) 05/27/2023   HCT 29.6 (L) 05/27/2023   MCV 91.6 05/27/2023   PLT 136 (L) 05/27/2023      05/28/2023    9:15 AM 05/27/2023   11:16 PM 06/08/2020    8:56 AM 06/06/2020   10:15 AM  Edinburgh Postnatal Depression Scale Screening Tool  I have been able to laugh and see the funny side of things. 0 -- 0 --  I have looked forward with enjoyment to things. 0  0   I have blamed myself unnecessarily when things went wrong. 1  2   I have been anxious or worried for no good reason. 0  2   I have felt scared or panicky for no good reason. 0  1   Things have been getting on top of me. 0  0   I have been so unhappy that I have had difficulty sleeping. 0  0   I have felt sad or miserable. 0  1   I have been so unhappy that I  have been crying. 0  0   The thought of harming myself has occurred to me. 0  0   Edinburgh Postnatal Depression Scale Total 1  6    Discharge instructions:  per After Visit Summary  After Visit Meds:  Allergies as of 05/28/2023   No Known Allergies      Medication List     STOP taking these medications    levocetirizine 5 MG tablet Commonly known as: XYZAL    MAGNESIUM  GLYCINATE PO   pantoprazole  40 MG tablet Commonly known as: PROTONIX    prenatal multivitamin Tabs tablet   PROBIOTIC DAILY PO   Vitamin D3 125 MCG (5000 UT) Caps   Zinc Gluconate 30 MG Tabs       TAKE these medications    acetaminophen  500 MG tablet Commonly known as: TYLENOL  Take 2 tablets (1,000 mg total) by mouth every 6 (six) hours. What changed:  when to take this reasons to take this   ibuprofen  800 MG tablet Commonly known as: ADVIL  Take 1 tablet (800 mg total)  by mouth every 8 (eight) hours.       Activity: Advance as tolerated. Pelvic rest for 6 weeks.   Newborn Data: Live born female  Birth Weight: 8 lb 8.2 oz (3860 g) APGAR: 9, 9  Newborn Delivery   Birth date/time: 05/26/2023 11:10:00 Delivery type: C-Section, Low Transverse Trial of labor: No C-section categorization: Repeat    Named Roschen Baby Feeding: Breast Circumcision: Completed, Dr. Arnett Lanius Disposition:home with mother  Delivery Report:  Review the Delivery Report for details.    Follow up:  Follow-up Information     Terri Fester, MD. Schedule an appointment as soon as possible for a visit in 6 week(s).   Specialty: Obstetrics and Gynecology Contact information: [address] [place] [address] [phone]                Annemarie Barry, MSN 05/28/2023, 9:50 AM

## 2023-06-08 ENCOUNTER — Telehealth (HOSPITAL_COMMUNITY): Payer: Self-pay | Admitting: *Deleted

## 2023-06-08 NOTE — Telephone Encounter (Signed)
 06/08/2023  Name: Catherine Hill MRN: [id number] DOB: [birth year]  Reason for Call:  Transition of Care Hospital Discharge Call  Contact Status: Patient Contact Status: Complete  Language assistant needed: Interpreter Mode: Interpreter Not Needed        Follow-Up Questions: Do You Have Any Concerns About Your Health As You Heal From Delivery?: No Do You Have Any Concerns About Your Infants Health?: No  Edinburgh Postnatal Depression Scale:  In the Past 7 Days: I have been able to laugh and see the funny side of things.: As much as I always could I have looked forward with enjoyment to things.: As much as I ever did I have blamed myself unnecessarily when things went wrong.: Not very often I have been anxious or worried for no good reason.: Hardly ever I have felt scared or panicky for no good reason.: No, not at all Things have been getting on top of me.: No, I have been coping as well as ever I have been so unhappy that I have had difficulty sleeping.: Not at all I have felt sad or miserable.: No, not at all I have been so unhappy that I have been crying.: No, never The thought of harming myself has occurred to me.: Never Dimple Francis Postnatal Depression Scale Total: 2  PHQ2-9 Depression Scale:     Discharge Follow-up: Edinburgh score requires follow up?: No Patient was advised of the following resources:: Support Group, Breastfeeding Support Group (declines postpartum group information via email)  Post-discharge interventions: Reviewed Newborn Safe Sleep Practices  Pearlie Bougie, RN 06/08/2023 11:10

## 2023-06-14 ENCOUNTER — Other Ambulatory Visit (HOSPITAL_COMMUNITY): Payer: Self-pay

## 2023-06-14 MED ORDER — METHYLERGONOVINE MALEATE 0.2 MG PO TABS
0.2000 mg | ORAL_TABLET | Freq: Three times a day (TID) | ORAL | 0 refills | Status: DC
  Filled 2023-06-14: qty 6, 2d supply, fill #0

## 2023-06-15 ENCOUNTER — Other Ambulatory Visit (HOSPITAL_COMMUNITY): Payer: Self-pay

## 2024-01-02 ENCOUNTER — Encounter: Payer: Self-pay | Admitting: Family Medicine

## 2024-01-02 NOTE — Progress Notes (Unsigned)
 No chief complaint on file.      PMH, PSH, SH reviewed  Had c-section and BTL in 05/2023, had another son    ROS:    PHYSICAL EXAM:  There were no vitals taken for this visit.      ASSESSMENT/PLAN:   Offer flu and COVID Probably had another TdaP during her lat pregnancy--date??   CPE scheduled for 02/2024

## 2024-01-03 ENCOUNTER — Ambulatory Visit: Payer: Self-pay | Admitting: Family Medicine

## 2024-01-03 ENCOUNTER — Ambulatory Visit: Admitting: Family Medicine

## 2024-01-03 VITALS — BP 120/70 | HR 68 | Ht 65.0 in | Wt 222.8 lb

## 2024-01-03 DIAGNOSIS — M545 Low back pain, unspecified: Secondary | ICD-10-CM

## 2024-01-03 DIAGNOSIS — F53 Postpartum depression: Secondary | ICD-10-CM | POA: Diagnosis not present

## 2024-01-03 DIAGNOSIS — Z23 Encounter for immunization: Secondary | ICD-10-CM

## 2024-01-03 DIAGNOSIS — E66812 Obesity, class 2: Secondary | ICD-10-CM | POA: Diagnosis not present

## 2024-01-03 DIAGNOSIS — Z6837 Body mass index (BMI) 37.0-37.9, adult: Secondary | ICD-10-CM

## 2024-01-03 LAB — POCT URINALYSIS DIP (PROADVANTAGE DEVICE)
Bilirubin, UA: NEGATIVE
Glucose, UA: NEGATIVE mg/dL
Ketones, POC UA: NEGATIVE mg/dL
Leukocytes, UA: NEGATIVE
Nitrite, UA: NEGATIVE
Protein Ur, POC: NEGATIVE mg/dL
Specific Gravity, Urine: 1.005
Urobilinogen, Ur: 0.2
pH, UA: 7 (ref 5.0–8.0)

## 2024-01-03 MED ORDER — MELOXICAM 15 MG PO TABS: ORAL_TABLET | ORAL | 0 refills | Status: DC

## 2024-01-03 NOTE — Patient Instructions (Addendum)
 Continue to go back to dietary recommendations from Rima, especially regarding the fiber and protein. You may even need to cut back some on the protein in order to increase your fiber. Please work on mindful eating.  Please check with your insurance regarding coverage for medications for weight loss (specifically Wegovy or Zepbound, or other oral medications if those aren't covered).  We discussed [facility]--you don't need a referral if you decide this is the route to go.  Take the meloxicam  once daily with food, until your pain has completely resolved. We referred you for physical therapy.

## 2024-01-03 NOTE — Progress Notes (Signed)
 Sent request

## 2024-01-03 NOTE — Progress Notes (Signed)
 Done

## 2024-01-04 ENCOUNTER — Encounter: Payer: Self-pay | Admitting: *Deleted

## 2024-01-12 ENCOUNTER — Ambulatory Visit: Admitting: Family Medicine

## 2024-01-18 NOTE — Progress Notes (Unsigned)
 No chief complaint on file.  Patient presents for 2 week follow-up on low back pain, obesity, difficulty losing weight.  LBP: She had pain at L SI joint, into buttock muscles.  She was referred to PT, and given a 2 week course of meloxicam . *** Obesity:  We discussed her weight gain (had lost weight after having baby in April, but then continued to gain, despite trying to do what she did when working with Rima in the past).  She endorsed +mindless eating during the day, when working from home. Doesn't meal prep, grabs food when she can. She reports eating a lot of protein. Has sugar cravings. Steevia in soda, just 1 day (her caffeine source). No other artificial sweeteners. +stress (related to working full time, 2 young kids, moving).  We discussed a diet high in fiber, adequate protein (but not excessive). Discussed artificial sweeteners as trigger for sugar cravings. Discussed mindful eating. Discussed impact of stress/cortisol on weight. Encouraged relaxation techniques, stress reduction. Discussed going back to some things recommended by Rima (dietician), including journaling (not excessive, but enough to keep accountable). We discussed [facility], use of GLP-1 medications (she was to check insurance coverage). 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    PMH, PSH, SH reviewed     ROS: No f/c, URI symptoms Denies HA, dizziness, CP or SOB. +knee pain with running Tingling in both feet with exercise L low back/butt pain per HPI Depression improved with sertraline. Frustrated/anxious about her weight and not being able to keep up with her kids. ***UPDATE   PHYSICAL EXAM:  LMP 01/03/2024 (Exact Date)   Wt Readings from Last 3 Encounters:  01/03/24 222 lb  12.8 oz (101.1 kg)  05/26/23 254 lb 9.6 oz (115.5 kg)  05/12/23 241 lb (109.3 kg)   Pleasant female, in no distress HEENT: conjunctiva and sclera are clear, EOMI Neck: no lymphadenopathy or mass Heart: regular rate and rhythm Lungs: clear bilaterally Back: no spinal or CVA tenderness. She is tender at L SI joint and in deep buttock muscles. No pain with pyriformis stretch, some pain with gluteal stretches. Neuro: alert and oriented, cranial nerves grossly intact. DTR's 2+ and symmetric, negative SLR< normal strength. Sensation not tested in feet (wearing shoes). ***UPDATE  Psych: ***   ASSESSMENT/PLAN:  I can't recall if she said she was nursing.  Please verify (not sure where that gets documented in chart) She was to have checked insurance coverage re: wt loss meds

## 2024-01-19 ENCOUNTER — Encounter: Payer: Self-pay | Admitting: Family Medicine

## 2024-01-19 ENCOUNTER — Ambulatory Visit: Admitting: Family Medicine

## 2024-01-19 VITALS — BP 110/72 | HR 76 | Ht 65.0 in | Wt 221.8 lb

## 2024-01-19 DIAGNOSIS — E66812 Obesity, class 2: Secondary | ICD-10-CM | POA: Diagnosis not present

## 2024-01-19 DIAGNOSIS — M545 Low back pain, unspecified: Secondary | ICD-10-CM | POA: Diagnosis not present

## 2024-01-19 DIAGNOSIS — Z6837 Body mass index (BMI) 37.0-37.9, adult: Secondary | ICD-10-CM | POA: Diagnosis not present

## 2024-01-19 DIAGNOSIS — F53 Postpartum depression: Secondary | ICD-10-CM

## 2024-01-19 MED ORDER — ZEPBOUND 2.5 MG/0.5ML ~~LOC~~ SOAJ: 2.5000 mg | SUBCUTANEOUS | 0 refills | Status: DC

## 2024-01-19 NOTE — Patient Instructions (Addendum)
 Continue regular stretches. Contact physical therapy for an appointment if your pain recurs or worsens.  We discussed seeing if your insurance covers the Zepbound. If prior shara gets denied, we discussed the cash-pay options for both Zepbound (through [employer], which is the VIALS), or [employer] through their company (which is the PENS).  We have a pharmacist that can assist with these forms, and education, if needed.  Be sure that while on these medications you continue to maintain adequate protein intake (120 gm or higher), and continue resistance/weight-bearing exercise. Be sure to also get plenty of fiber and water  in your diet (constipation is a common side effect).  Let me know if the pens are covered, and you how do on the 2.5 mg.  I will need to send in the 5 mg dose for next month. Please check in monthly with us --with your weight, any side effects, and if you are ready to go up to the next dose.

## 2024-01-20 ENCOUNTER — Encounter: Payer: Self-pay | Admitting: Family Medicine

## 2024-01-20 DIAGNOSIS — E66812 Obesity, class 2: Secondary | ICD-10-CM

## 2024-01-21 ENCOUNTER — Other Ambulatory Visit (HOSPITAL_COMMUNITY): Payer: Self-pay

## 2024-01-21 MED ORDER — ZEPBOUND 2.5 MG/0.5ML ~~LOC~~ SOAJ: 2.5000 mg | SUBCUTANEOUS | 0 refills | Status: DC

## 2024-01-21 NOTE — Telephone Encounter (Signed)
[facility]

## 2024-01-25 ENCOUNTER — Telehealth: Payer: Self-pay

## 2024-01-25 ENCOUNTER — Other Ambulatory Visit (HOSPITAL_COMMUNITY): Payer: Self-pay

## 2024-01-25 NOTE — Telephone Encounter (Signed)
 Pharmacy Patient Advocate Encounter   Received notification from Patient Advice Request messages that prior authorization for Zepbound  is required/requested.   Insurance verification completed.   The patient is insured through [employer].   Per test claim: PA required; PA submitted to above mentioned insurance via Latent Key/confirmation #/[id number] [facility] Status is pending

## 2024-01-25 NOTE — Telephone Encounter (Signed)
 Pharmacy Patient Advocate Encounter   Received notification from [facility] that Prior Authorization for Zepbound  2.5MG /0.5ML pen-injectors has been APPROVED from 11.16.25 to 8.13.26. Ran test claim, Copay is $0.00. This test claim was processed through [facility]- copay amounts may vary at other pharmacies due to pharmacy/plan contracts, or as the patient moves through the different stages of their insurance plan.    PA #/Case ID/Reference #: [id number]

## 2024-02-16 NOTE — Patient Instructions (Signed)

## 2024-02-16 NOTE — Progress Notes (Signed)
 Chief Complaint  Patient presents with   Annual Exam    Fasting annual exam. No new concerns. Anxiety is better, meds helping.    Catherine Hill is a 39 y.o. female who presents for a complete physical.  She was seen recently with LBP (at L SI joint, into buttocks).  Her pain improved with meloxicam  and home stretches, never needed PT. She continues to do well.  Obesity:  We discussed her weight gain and dietary recommendations in detail at her last 2 visits. Mounjaro 2.5 mg was prescribed (and covered by her insurance). She has taken 2 doses and denies any side effects. She has an app that she is required to use in order for insurance to cover the [employer] and learning journeys. ([employer])--also has a control and instrumentation engineer that sends her recipes.  Vitamin D  deficiency:  Level was normal at 67.7 in May 2024 when taking 5000 IU daily (after completing a prescription course for low level of 23.5 in 11/2021, when she had only been taking PNV daily). She subsequently switched to taking D3 5000 IU every 3 days. She is currently taking 5000 IU 3 days/week.  Component Ref Range & Units (hover) 1 yr ago 2 yr ago 10 yr ago  Vit D, 25-Hydroxy 67.7 23.5 Low  CM 40 R, CM   History of anxiety--she had been prescribed lexapro  at one point, but never started it.  Her anxiety improved with accupuncture, and chinese herbs to balance her hormones. She felt like the vitamin D  replacement had helped her energy, and was feeling better. Anxiety never recurred. She is now on sertraline 25 mg (started for postpartum depression). Moods are very good, denies anxiety or depression.   Immunization History  Administered Date(s) Administered   DTaP 09/15/1999   HPV Quadrivalent 09/17/2005, 07/10/2008, 06/27/2009   Influenza Split 12/11/2010   Influenza, Seasonal, Injecte, Preservative Fre 01/03/2024   Influenza,inj,Quad PF,6+ Mos 11/12/2020, 11/10/2021   Influenza-Unspecified 10/25/2014, 11/10/2017,  11/10/2022   PFIZER(Purple Top)SARS-COV-2 Vaccination 04/25/2019, 05/16/2019, 01/13/2020   Pfizer Covid-19 Vaccine Bivalent Booster 82yrs & up 02/15/2021   Pfizer(Comirnaty)Fall Seasonal Vaccine 12 years and older 01/08/2022   Tdap 06/27/2009, 03/28/2020, 04/07/2023   Last Pap smear: 11/15/2019 normal, negative HPV Last mammogram: 2007 Last colonoscopy: never Last DEXA: never Dentist: twice a year Ophtho: yearly (wears contacts) Exercise:  20-30 mins of weights and HIIT 3x/week.  Has a walking pad at her desk, uses while working from home.  Tries to walk 1-2 miles on lunch breaks when working from home. Walks the kids to/from school.  Lipid screen: Lab Results  Component Value Date   CHOL 166 06/17/2022   HDL 51 06/17/2022   LDLCALC 103 (H) 06/17/2022   TRIG 59 06/17/2022   CHOLHDL 3.3 06/17/2022     PMH, PSH, SH and FH were reviewed and updated  Outpatient Encounter Medications as of 02/17/2024  Medication Sig Note   cholecalciferol (VITAMIN D3) 25 MCG (1000 UNIT) tablet Take 5,000 Units by mouth. (Patient taking differently: Take 5,000 Units by mouth. Takes 3 days/week) 02/17/2024: Has a 5000 IU dose, takes 3x/week   MAGNESIUM  GLYCINATE PO Take 200 mg by mouth daily.    Multiple Vitamins-Minerals (MULTIVITAMIN WITH MINERALS) tablet Take 1 tablet by mouth daily.    sertraline (ZOLOFT) 25 MG tablet Take 25 mg by mouth daily.    tirzepatide  (ZEPBOUND ) 2.5 MG/0.5ML Pen Inject 2.5 mg into the skin once a week. 02/17/2024: Takes on Saturdays.    Zinc 50 MG TABS  Take 1 tablet by mouth daily.    [DISCONTINUED] meloxicam  (MOBIC ) 15 MG tablet Take 1 tablet by mouth daily with food. Take until pain has resolved    No facility-administered encounter medications on file as of 02/17/2024.   No Known Allergies   ROS:  Denies fever, headaches,  vision changes, decreased hearing, ear pain, sore throat, breast concerns, chest pain, palpitations, dizziness, syncope, dyspnea on exertion, swelling,  nausea, vomiting, diarrhea, constipation, abdominal pain, melena, hematochezia, indigestion/heartburn, hematuria, incontinence, dysuria, irregular menstrual cycles, vaginal discharge, odor or itch, genital lesions, joint pains, numbness, tingling, weakness, tremor, suspicious skin lesions, depression, abnormal bleeding/bruising, or enlarged lymph nodes. Moods are good. No longer having back pain issues, regular stretching helps. Had a cold over the holidays, no ongoing symptoms.   PHYSICAL EXAM:  BP 106/76   Pulse 68   Ht 5' 5 (1.651 m)   Wt 222 lb 12.8 oz (101.1 kg)   LMP 01/30/2024   Breastfeeding No   BMI 37.08 kg/m   Wt Readings from Last 3 Encounters:  02/17/24 222 lb 12.8 oz (101.1 kg)  01/19/24 221 lb 12.8 oz (100.6 kg)  01/03/24 222 lb 12.8 oz (101.1 kg)   General Appearance:    Alert, cooperative, no distress, appears stated age. She elected not to change into gown--sees dermatologist and GYN.   Head:    Normocephalic, without obvious abnormality, atraumatic  Eyes:    PERRL, conjunctiva/corneas clear, EOM's intact, fundi    benign  Ears:    Normal TM's and external ear canals  Nose:   No drainage or sinus tenderness  Throat:   Lips, mucosa, and tongue normal; teeth and gums normal  Neck:   Supple, no lymphadenopathy;  thyroid:  no enlargement/ tenderness/nodules; no carotid bruit or JVD  Back:    Spine nontender, no curvature, ROM normal, no CVA     tenderness  Lungs:     Clear to auscultation bilaterally without wheezes, rales or     ronchi; respirations unlabored  Chest Wall:    No tenderness or deformity   Heart:    Regular rate and rhythm, S1 and S2 normal, no murmur, rub   or gallop  Breast Exam:    Deferred to GYN  Abdomen:     Soft, non-tender, nondistended, normoactive bowel sounds,    no masses, no hepatosplenomegaly  Genitalia:    Deferred to GYN     Extremities:   No clubbing, cyanosis or edema  Pulses:   2+ and symmetric all extremities  Skin:   Skin  color, texture, turgor normal, no rashes or lesions. Exam is limited due to not changing into gown.   Lymph nodes:   Cervical, supraclavicular nodes normal  Neurologic:   CNII-XII intact, normal strength, sensation and gait; reflexes 2+ and symmetric throughout          Psych:   Normal mood, affect, hygiene and grooming.         02/17/2024    8:33 AM 01/28/2023    9:48 AM 01/08/2022    3:00 PM 11/10/2021    9:11 AM 02/20/2016    8:47 AM  Depression screen PHQ 2/9  Decreased Interest 0 0 0 0 0  Down, Depressed, Hopeless 0 0 0 0 0  PHQ - 2 Score 0 0 0 0 0     ASSESSMENT/PLAN:  Annual physical exam - Plan: Lipid panel, CBC with Differential/Platelet, CMP14+EGFR, VITAMIN D  25 Hydroxy (Vit-D Deficiency, Fractures), TSH  Class 2 severe obesity due  to excess calories with serious comorbidity and body mass index (BMI) of 37.0 to 37.9 in adult - continue with titration of Zepbound --written for 5 and 7.5mg . To contact us  after that and f/u 3 mos. Cont with weekly weights, coaching - Plan: tirzepatide  (ZEPBOUND ) 5 MG/0.5ML Pen, tirzepatide  (ZEPBOUND ) 7.5 MG/0.5ML Pen  Vitamin D  deficiency - Plan: VITAMIN D  25 Hydroxy (Vit-D Deficiency, Fractures)  Weight gain - Plan: TSH  Medication monitoring encounter - Plan: CBC with Differential/Platelet, CMP14+EGFR, VITAMIN D  25 Hydroxy (Vit-D Deficiency, Fractures)  Discussed monthly self breast exams and yearly mammograms after the age of 85; at least 30 minutes of aerobic activity at least 5 days/week, weight-bearing exercise at least 2x/week; proper sunscreen use reviewed; healthy diet, including goals of calcium and vitamin D  intake and alcohol recommendations (less than or equal to 1 drink/day) reviewed; regular seatbelt use; changing batteries in smoke detectors.  Immunization recommendations discussed-- continue yearly flu shots.  Declined COVID booster today. Colon cancer screening recommendations reviewed, age 27.  F/u 1 year, sooner prn.

## 2024-02-17 ENCOUNTER — Ambulatory Visit (INDEPENDENT_AMBULATORY_CARE_PROVIDER_SITE_OTHER): Payer: 59 | Admitting: Family Medicine

## 2024-02-17 ENCOUNTER — Encounter: Payer: Self-pay | Admitting: Family Medicine

## 2024-02-17 VITALS — BP 106/76 | HR 68 | Ht 65.0 in | Wt 222.8 lb

## 2024-02-17 DIAGNOSIS — Z5181 Encounter for therapeutic drug level monitoring: Secondary | ICD-10-CM | POA: Diagnosis not present

## 2024-02-17 DIAGNOSIS — E66812 Obesity, class 2: Secondary | ICD-10-CM

## 2024-02-17 DIAGNOSIS — R635 Abnormal weight gain: Secondary | ICD-10-CM

## 2024-02-17 DIAGNOSIS — Z0000 Encounter for general adult medical examination without abnormal findings: Secondary | ICD-10-CM | POA: Diagnosis not present

## 2024-02-17 DIAGNOSIS — Z6837 Body mass index (BMI) 37.0-37.9, adult: Secondary | ICD-10-CM | POA: Diagnosis not present

## 2024-02-17 DIAGNOSIS — E559 Vitamin D deficiency, unspecified: Secondary | ICD-10-CM

## 2024-02-17 MED ORDER — ZEPBOUND 7.5 MG/0.5ML ~~LOC~~ SOAJ: 7.5000 mg | SUBCUTANEOUS | 0 refills | Status: AC

## 2024-02-17 MED ORDER — ZEPBOUND 5 MG/0.5ML ~~LOC~~ SOAJ: 5.0000 mg | SUBCUTANEOUS | 0 refills | Status: AC

## 2024-02-18 ENCOUNTER — Ambulatory Visit: Payer: Self-pay | Admitting: Family Medicine

## 2024-02-18 LAB — CBC WITH DIFFERENTIAL/PLATELET
Basophils Absolute: 0 x10E3/uL (ref 0.0–0.2)
Basos: 1 %
EOS (ABSOLUTE): 0 x10E3/uL (ref 0.0–0.4)
Eos: 0 %
Hematocrit: 39.7 % (ref 34.0–46.6)
Hemoglobin: 12.8 g/dL (ref 11.1–15.9)
Immature Grans (Abs): 0 x10E3/uL (ref 0.0–0.1)
Immature Granulocytes: 0 %
Lymphocytes Absolute: 1.9 x10E3/uL (ref 0.7–3.1)
Lymphs: 38 %
MCH: 29.5 pg (ref 26.6–33.0)
MCHC: 32.2 g/dL (ref 31.5–35.7)
MCV: 92 fL (ref 79–97)
Monocytes Absolute: 0.4 x10E3/uL (ref 0.1–0.9)
Monocytes: 8 %
Neutrophils Absolute: 2.6 x10E3/uL (ref 1.4–7.0)
Neutrophils: 53 %
Platelets: 231 x10E3/uL (ref 150–450)
RBC: 4.34 x10E6/uL (ref 3.77–5.28)
RDW: 12.2 % (ref 11.7–15.4)
WBC: 4.9 x10E3/uL (ref 3.4–10.8)

## 2024-02-18 LAB — CMP14+EGFR
ALT: 35 IU/L — ABNORMAL HIGH (ref 0–32)
AST: 34 IU/L (ref 0–40)
Albumin: 4.5 g/dL (ref 3.9–4.9)
Alkaline Phosphatase: 101 IU/L (ref 41–116)
BUN/Creatinine Ratio: 19 (ref 9–23)
BUN: 13 mg/dL (ref 6–20)
Bilirubin Total: 0.5 mg/dL (ref 0.0–1.2)
CO2: 20 mmol/L (ref 20–29)
Calcium: 9.2 mg/dL (ref 8.7–10.2)
Chloride: 101 mmol/L (ref 96–106)
Creatinine, Ser: 0.7 mg/dL (ref 0.57–1.00)
Globulin, Total: 3.2 g/dL (ref 1.5–4.5)
Glucose: 79 mg/dL (ref 70–99)
Potassium: 4.2 mmol/L (ref 3.5–5.2)
Sodium: 135 mmol/L (ref 134–144)
Total Protein: 7.7 g/dL (ref 6.0–8.5)
eGFR: 113 mL/min/1.73

## 2024-02-18 LAB — VITAMIN D 25 HYDROXY (VIT D DEFICIENCY, FRACTURES): Vit D, 25-Hydroxy: 31.4 ng/mL (ref 30.0–100.0)

## 2024-02-18 LAB — LIPID PANEL
Chol/HDL Ratio: 3.9 ratio (ref 0.0–4.4)
Cholesterol, Total: 168 mg/dL (ref 100–199)
HDL: 43 mg/dL
LDL Chol Calc (NIH): 107 mg/dL — ABNORMAL HIGH (ref 0–99)
Triglycerides: 96 mg/dL (ref 0–149)
VLDL Cholesterol Cal: 18 mg/dL (ref 5–40)

## 2024-02-18 LAB — TSH: TSH: 1.72 u[IU]/mL (ref 0.450–4.500)

## 2024-03-13 ENCOUNTER — Encounter: Admitting: Family Medicine

## 2024-05-29 ENCOUNTER — Ambulatory Visit: Admitting: Family Medicine

## 2025-03-05 ENCOUNTER — Encounter: Admitting: Family Medicine
